# Patient Record
Sex: Male | Born: 1955 | Race: White | Hispanic: No | Marital: Married | Smoking: Former smoker
Health system: Southern US, Community
[De-identification: ages and names within clinical notes are randomized; demographics above are authoritative.]

## PROBLEM LIST (undated history)

## (undated) DIAGNOSIS — I10 Essential (primary) hypertension: Secondary | ICD-10-CM

## (undated) DIAGNOSIS — F419 Anxiety disorder, unspecified: Secondary | ICD-10-CM

## (undated) HISTORY — PX: HERNIA REPAIR: SHX51

## (undated) HISTORY — PX: BACK SURGERY: SHX140

---

## 2017-05-28 ENCOUNTER — Encounter: Payer: Self-pay | Admitting: Internal Medicine

## 2017-06-25 ENCOUNTER — Ambulatory Visit: Payer: Self-pay

## 2017-07-09 ENCOUNTER — Ambulatory Visit: Payer: Self-pay

## 2017-07-19 ENCOUNTER — Encounter: Payer: Self-pay | Admitting: Internal Medicine

## 2017-07-23 ENCOUNTER — Ambulatory Visit: Payer: Self-pay

## 2017-08-20 ENCOUNTER — Ambulatory Visit: Payer: Self-pay

## 2017-09-09 ENCOUNTER — Ambulatory Visit (INDEPENDENT_AMBULATORY_CARE_PROVIDER_SITE_OTHER): Payer: BLUE CROSS/BLUE SHIELD

## 2017-09-09 DIAGNOSIS — Z86010 Personal history of colonic polyps: Secondary | ICD-10-CM

## 2017-09-09 MED ORDER — NA SULFATE-K SULFATE-MG SULF 17.5-3.13-1.6 GM/177ML PO SOLN: 1.0000 | ORAL | 0 refills | Status: DC

## 2017-09-09 NOTE — Progress Notes (Signed)
Gastroenterology Pre-Procedure Review  Request Date:09/09/17 Requesting Physician: Dr.Vyas (no previous tcs)  PATIENT REVIEW QUESTIONS: The patient responded to the following health history questions as indicated:    1. Diabetes Melitis: no 2. Joint replacements in the past 12 months: no 3. Major health problems in the past 3 months: no 4. Has an artificial valve or MVP: no 5. Has a defibrillator: no 6. Has been advised in past to take antibiotics in advance of a procedure like teeth cleaning: no 7. Family history of colon cancer: no  8. Alcohol Use: no 9. History of sleep apnea: no  10. History of coronary artery or other vascular stents placed within the last 12 months: no 11. History of any prior anesthesia complications: no    MEDICATIONS & ALLERGIES:    Patient reports the following regarding taking any blood thinners:   Plavix? no Aspirin? no Coumadin? no Brilinta? no Xarelto? no Eliquis? no Pradaxa? no Savaysa? no Effient? no  Patient confirms/reports the following medications:  Current Outpatient Medications  Medication Sig Dispense Refill  . loratadine (CLARITIN) 10 MG tablet Take 10 mg by mouth daily.    . meloxicam (MOBIC) 15 MG tablet Take 15 mg by mouth daily.  2  . omeprazole (PRILOSEC) 20 MG capsule Take 20 mg by mouth daily.  3  . zolpidem (AMBIEN) 5 MG tablet Take 5 mg by mouth at bedtime.  2  . Na Sulfate-K Sulfate-Mg Sulf (SUPREP BOWEL PREP KIT) 17.5-3.13-1.6 GM/177ML SOLN Take 1 kit by mouth as directed. 1 Bottle 0   No current facility-administered medications for this visit.     Patient confirms/reports the following allergies:  No Known Allergies  No orders of the defined types were placed in this encounter.   AUTHORIZATION INFORMATION Primary Insurance: [place],  [place] #: [id number] Pre-Cert / Josem Kaufmann required: no   SCHEDULE INFORMATION: Procedure has been scheduled as follows:  Date: 10/25/17, Time: 7:30 Location: [facility] Dr.Rourk  This  Gastroenterology Pre-Precedure Review Form is being routed to the following provider(s): AB

## 2017-09-09 NOTE — Patient Instructions (Addendum)
Milam Allbaugh  02/14/1956 MRN: [id number]     Procedure Date: 10/09/17 Time to register: 12:45pm Place to register: [facility] Procedure Time: 1:45pm Scheduled provider: R. Garfield Cornea, MD    PREPARATION FOR COLONOSCOPY WITH SUPREP BOWEL PREP KIT  Note: Suprep Bowel Prep Kit is a split-dose (2day) regimen. Consumption of BOTH 6-ounce bottles is required for a complete prep.  Please notify us immediately if you are diabetic, take iron supplements, or if you are on Coumadin or any other blood thinners.                                                                                                                                                    2 DAYS BEFORE PROCEDURE:  DATE: 10/07/17  DAY: Monday Begin clear liquid diet AFTER your lunch meal. NO SOLID FOODS after this point.  1 DAY BEFORE PROCEDURE:  DATE: 10/08/17  DAY: Tuesday Continue clear liquids the entire day - NO SOLID FOOD.     At 6:00pm: Complete steps 1 through 4 below, using ONE (1) 6-ounce bottle, before going to bed. Step 1:  Pour ONE (1) 6-ounce bottle of SUPREP liquid into the mixing container.  Step 2:  Add cool drinking water to the 16 ounce line on the container and mix.  Note: Dilute the solution concentrate as directed prior to use. Step 3:  DRINK ALL the liquid in the container. Step 4:  You MUST drink an additional two (2) or more 16 ounce containers of water over the next one (1) hour.   Continue clear liquids.  DAY OF PROCEDURE:   DATE: 10/09/17  DAY: Wednesday If you take medications for your heart, blood pressure, or breathing, you may take these medications.    5 hours before your procedure at 8:45am: Step 1:  Pour ONE (1) 6-ounce bottle of SUPREP liquid into the mixing container.  Step 2:  Add cool drinking water to the 16 ounce line on the container and mix.  Note: Dilute the solution concentrate as directed prior to use. Step 3:  DRINK ALL the liquid in the container. Step 4:  You  MUST drink an additional two (2) or more 16 ounce containers of water over the next one (1) hour. You MUST complete the final glass of water at least 3 hours before your colonoscopy.   Nothing by mouth past 10:45am  You may take your morning medications with sip of water unless we have instructed otherwise.    Please see below for Dietary Information.  CLEAR LIQUIDS INCLUDE:  Water Jello (NOT red in color)   Ice Popsicles (NOT red in color)   Tea (sugar ok, no milk/cream) Powdered fruit flavored drinks  Coffee (sugar ok, no milk/cream) Gatorade/ Lemonade/ Kool-Aid  (NOT red in color)   Juice: apple, white grape, white cranberry Soft drinks  Clear bullion, consomme, broth (fat free beef/chicken/vegetable)  Carbonated beverages (any kind)  Strained chicken noodle soup Hard Candy   Remember: Clear liquids are liquids that will allow you to see your fingers on the other side of a clear glass. Be sure liquids are NOT red in color, and not cloudy, but CLEAR.  DO NOT EAT OR DRINK ANY OF THE FOLLOWING:  Dairy products of any kind   Cranberry juice Tomato juice / V8 juice   Grapefruit juice Orange juice     Red grape juice  Do not eat any solid foods, including such foods as: cereal, oatmeal, yogurt, fruits, vegetables, creamed soups, eggs, bread, crackers, pureed foods in a blender, etc.   HELPFUL HINTS FOR DRINKING PREP SOLUTION:   Make sure prep is extremely cold. Mix and refrigerate the the morning of the prep. You may also put in the freezer.   You may try mixing some Crystal Light or Country Time Lemonade if you prefer. Mix in small amounts; add more if necessary.  Try drinking through a straw  Rinse mouth with water or a mouthwash between glasses, to remove after-taste.  Try sipping on a cold beverage /ice/ popsicles between glasses of prep.  Place a piece of sugar-free hard candy in mouth between glasses.  If you become nauseated, try consuming smaller amounts, or stretch  out the time between glasses. Stop for 30-60 minutes, then slowly start back drinking.     OTHER INSTRUCTIONS  You will need a responsible adult at least 61 years of age to accompany you and drive you home. This person must remain in the waiting room during your procedure. The hospital will cancel your procedure if you do not have a responsible adult with you.   1. Wear loose fitting clothing that is easily removed. 2. Leave jewelry and other valuables at home.  3. Remove all body piercing jewelry and leave at home. 4. Total time from sign-in until discharge is approximately 2-3 hours. 5. You should go home directly after your procedure and rest. You can resume normal activities the day after your procedure. 6. The day of your procedure you should not:  Drive  Make legal decisions  Operate machinery  Drink alcohol  Return to work   You may call the office (Dept: [phone]) before 5:00pm, or page the doctor on call ([phone]) after 5:00pm, for further instructions, if necessary.   Insurance Information YOU WILL NEED TO CHECK WITH YOUR INSURANCE COMPANY FOR THE BENEFITS OF COVERAGE YOU HAVE FOR THIS PROCEDURE.  UNFORTUNATELY, NOT ALL INSURANCE COMPANIES HAVE BENEFITS TO COVER ALL OR PART OF THESE TYPES OF PROCEDURES.  IT IS YOUR RESPONSIBILITY TO CHECK YOUR BENEFITS, HOWEVER, WE WILL BE GLAD TO ASSIST YOU WITH ANY CODES YOUR INSURANCE COMPANY MAY NEED.    PLEASE NOTE THAT MOST INSURANCE COMPANIES WILL NOT COVER A SCREENING COLONOSCOPY FOR PEOPLE UNDER THE AGE OF 50  IF YOU HAVE BCBS INSURANCE, YOU MAY HAVE BENEFITS FOR A SCREENING COLONOSCOPY BUT IF POLYPS ARE FOUND THE DIAGNOSIS WILL CHANGE AND THEN YOU MAY HAVE A DEDUCTIBLE THAT WILL NEED TO BE MET. SO PLEASE MAKE SURE YOU CHECK YOUR BENEFITS FOR A SCREENING COLONOSCOPY AS WELL AS A DIAGNOSTIC COLONOSCOPY.      

## 2017-09-10 NOTE — Progress Notes (Signed)
Appropriate.

## 2017-09-16 ENCOUNTER — Telehealth: Payer: Self-pay | Admitting: Internal Medicine

## 2017-09-16 NOTE — Telephone Encounter (Signed)
Pt wants his colonoscopy moved up to sooner because of Labor Day. Please call him back (he works 3rd shift) [phone]

## 2017-09-17 ENCOUNTER — Telehealth: Payer: Self-pay | Admitting: Internal Medicine

## 2017-09-17 NOTE — Telephone Encounter (Signed)
[place] HE NEEDS TO RESCHEDULE HIS TCS, HE WORKS 3RD AND WILL BE ASLEEP IN ABOUT AN HOUR

## 2017-09-17 NOTE — Telephone Encounter (Signed)
Pt has been rescheduled. 

## 2017-09-17 NOTE — Progress Notes (Signed)
Pt called- he cannot get off work the day he is scheduled and needs to be rescheduled. I have moved him to 10/09/17 and have changed his instructions and mailed them to him. He also needs a new note for work and this has been mailed. I called Hoyle Sauer and LM to change his date on the schedule. Pt has already picked up his prep from the pharmacy.

## 2017-10-09 ENCOUNTER — Other Ambulatory Visit: Payer: Self-pay

## 2017-10-09 ENCOUNTER — Ambulatory Visit (HOSPITAL_COMMUNITY)
Admission: RE | Admit: 2017-10-09 | Discharge: 2017-10-09 | Disposition: A | Discharge status: Home / Self Care | Payer: BLUE CROSS/BLUE SHIELD | Source: Ambulatory Visit | Attending: Internal Medicine | Admitting: Internal Medicine

## 2017-10-09 ENCOUNTER — Encounter (HOSPITAL_COMMUNITY): Payer: Self-pay | Admitting: *Deleted

## 2017-10-09 ENCOUNTER — Encounter (HOSPITAL_COMMUNITY)
Admission: RE | Disposition: A | Discharge status: Home / Self Care | Payer: Self-pay | Source: Ambulatory Visit | Attending: Internal Medicine

## 2017-10-09 DIAGNOSIS — Z1211 Encounter for screening for malignant neoplasm of colon: Secondary | ICD-10-CM | POA: Diagnosis not present

## 2017-10-09 DIAGNOSIS — Z1212 Encounter for screening for malignant neoplasm of rectum: Secondary | ICD-10-CM | POA: Diagnosis not present

## 2017-10-09 DIAGNOSIS — K5730 Diverticulosis of large intestine without perforation or abscess without bleeding: Secondary | ICD-10-CM | POA: Insufficient documentation

## 2017-10-09 DIAGNOSIS — Z791 Long term (current) use of non-steroidal anti-inflammatories (NSAID): Secondary | ICD-10-CM | POA: Insufficient documentation

## 2017-10-09 DIAGNOSIS — Z79899 Other long term (current) drug therapy: Secondary | ICD-10-CM | POA: Insufficient documentation

## 2017-10-09 DIAGNOSIS — Z86010 Personal history of colonic polyps: Secondary | ICD-10-CM

## 2017-10-09 DIAGNOSIS — Z87891 Personal history of nicotine dependence: Secondary | ICD-10-CM | POA: Diagnosis not present

## 2017-10-09 DIAGNOSIS — D128 Benign neoplasm of rectum: Secondary | ICD-10-CM | POA: Insufficient documentation

## 2017-10-09 HISTORY — PX: COLONOSCOPY: SHX5424

## 2017-10-09 SURGERY — COLONOSCOPY
Anesthesia: Moderate Sedation

## 2017-10-09 MED ORDER — MIDAZOLAM HCL 5 MG/5ML IJ SOLN
INTRAMUSCULAR | Status: AC
  Filled 2017-10-09: qty 5

## 2017-10-09 MED ORDER — ONDANSETRON HCL 4 MG/2ML IJ SOLN
INTRAMUSCULAR | Status: AC
  Filled 2017-10-09: qty 2

## 2017-10-09 MED ORDER — MEPERIDINE HCL 50 MG/ML IJ SOLN
INTRAMUSCULAR | Status: AC
  Filled 2017-10-09: qty 1

## 2017-10-09 MED ORDER — MIDAZOLAM HCL 5 MG/5ML IJ SOLN
INTRAMUSCULAR | Status: DC | PRN
  Administered 2017-10-09: 2 mg via INTRAVENOUS
  Administered 2017-10-09: 1 mg via INTRAVENOUS
  Administered 2017-10-09: 2 mg via INTRAVENOUS
  Administered 2017-10-09: 1 mg via INTRAVENOUS

## 2017-10-09 MED ORDER — MEPERIDINE HCL 100 MG/ML IJ SOLN
INTRAMUSCULAR | Status: DC | PRN
  Administered 2017-10-09: 25 mg

## 2017-10-09 MED ORDER — ONDANSETRON HCL 4 MG/2ML IJ SOLN
INTRAMUSCULAR | Status: DC | PRN
  Administered 2017-10-09: 4 mg via INTRAVENOUS

## 2017-10-09 MED ORDER — SODIUM CHLORIDE 0.9 % IV SOLN
INTRAVENOUS | Status: DC
  Administered 2017-10-09: 1000 mL via INTRAVENOUS

## 2017-10-09 NOTE — H&P (Signed)
@LOGO @   Primary Care Physician:  Glenda Chroman, MD Primary Gastroenterologist:  Dr. Gala Romney  Pre-Procedure History & Physical: HPI:  Brendan Sherman is a 62 y.o. male here for screening colonoscopy. No bowel symptoms. No family history of colon cancer. No prior colonoscopy.  History reviewed. No pertinent past medical history.  Past Surgical History:  Procedure Laterality Date  . HERNIA REPAIR      Prior to Admission medications   Medication Sig Start Date End Date Taking? Authorizing Provider  diclofenac sodium (VOLTAREN) 1 % GEL Apply 1 application topically daily. 09/16/17  Yes [provider]  loratadine (CLARITIN) 10 MG tablet Take 10 mg by mouth daily.   Yes [provider]  meloxicam (MOBIC) 15 MG tablet Take 15 mg by mouth daily. 08/26/17  Yes [provider]  Na Sulfate-K Sulfate-Mg Sulf (SUPREP BOWEL PREP KIT) 17.5-3.13-1.6 GM/177ML SOLN Take 1 kit by mouth as directed. 09/09/17  Yes Annitta Needs, NP  omeprazole (PRILOSEC) 20 MG capsule Take 20 mg by mouth daily as needed (for acid reflux).  08/21/17  Yes [provider]  zolpidem (AMBIEN) 5 MG tablet Take 5 mg by mouth at bedtime. 08/27/17  Yes [provider]    Allergies as of 09/09/2017  . (No Known Allergies)    History reviewed. No pertinent family history.  Social History   Socioeconomic History  . Marital status: Married    Spouse name: Not on file  . Number of children: Not on file  . Years of education: Not on file  . Highest education level: Not on file  Occupational History  . Not on file  Social Needs  . Financial resource strain: Not on file  . Food insecurity:    Worry: Not on file    Inability: Not on file  . Transportation needs:    Medical: Not on file    Non-medical: Not on file  Tobacco Use  . Smoking status: Former Research scientist (life sciences)  . Smokeless tobacco: Former Network engineer and Sexual Activity  . Alcohol use: Yes    Comment: rarely  . Drug use: Never  .  Sexual activity: Not on file  Lifestyle  . Physical activity:    Days per week: Not on file    Minutes per session: Not on file  . Stress: Not on file  Relationships  . Social connections:    Talks on phone: Not on file    Gets together: Not on file    Attends religious service: Not on file    Active member of club or organization: Not on file    Attends meetings of clubs or organizations: Not on file    Relationship status: Not on file  . Intimate partner violence:    Fear of current or ex partner: Not on file    Emotionally abused: Not on file    Physically abused: Not on file    Forced sexual activity: Not on file  Other Topics Concern  . Not on file  Social History Narrative  . Not on file    Review of Systems: See HPI, otherwise negative ROS  Physical Exam: BP (!) 169/99   Pulse 78   Temp 98 F (36.7 C) (Oral)   Resp 13   Ht 5' 9.5" (1.765 m)   Wt 80.7 kg   SpO2 100%   BMI 25.91 kg/m  General:   Alert,  Well-developed, well-nourished, pleasant and cooperative in NAD  Lungs:  Clear throughout to auscultation.  No wheezes, crackles, or rhonchi. No acute distress. Heart:  Regular rate and rhythm; no murmurs, clicks, rubs,  or gallops. Abdomen: Non-distended, normal bowel sounds.  Soft and nontender without appreciable mass or hepatosplenomegaly.  Pulses:  Normal pulses noted. Extremities:  Without clubbing or edema.  Impression/Plan:  71 year old child here for first ever screening colonoscopy-average risk.  The risks, benefits, limitations, alternatives and imponderables have been reviewed with the patient. Questions have been answered. All parties are agreeable.      Notice: This dictation was prepared with Dragon dictation along with smaller phrase technology. Any transcriptional errors that result from this process are unintentional and may not be corrected upon review.

## 2017-10-09 NOTE — Discharge Instructions (Signed)
Colonoscopy Discharge Instructions  Read the instructions outlined below and refer to this sheet in the next few weeks. These discharge instructions provide you with general information on caring for yourself after you leave the hospital. Your doctor may also give you specific instructions. While your treatment has been planned according to the most current medical practices available, unavoidable complications occasionally occur. If you have any problems or questions after discharge, call Dr. Gala Romney at [phone]. ACTIVITY  You may resume your regular activity, but move at a slower pace for the next 24 hours.   Take frequent rest periods for the next 24 hours.   Walking will help get rid of the air and reduce the bloated feeling in your belly (abdomen).   No driving for 24 hours (because of the medicine (anesthesia) used during the test).    Do not sign any important legal documents or operate any machinery for 24 hours (because of the anesthesia used during the test).  NUTRITION  Drink plenty of fluids.   You may resume your normal diet as instructed by your doctor.   Begin with a light meal and progress to your normal diet. Heavy or fried foods are harder to digest and may make you feel sick to your stomach (nauseated).   Avoid alcoholic beverages for 24 hours or as instructed.  MEDICATIONS  You may resume your normal medications unless your doctor tells you otherwise.  WHAT YOU CAN EXPECT TODAY  Some feelings of bloating in the abdomen.   Passage of more gas than usual.   Spotting of blood in your stool or on the toilet paper.  IF YOU HAD POLYPS REMOVED DURING THE COLONOSCOPY:  No aspirin products for 7 days or as instructed.   No alcohol for 7 days or as instructed.   Eat a soft diet for the next 24 hours.  FINDING OUT THE RESULTS OF YOUR TEST Not all test results are available during your visit. If your test results are not back during the visit, make an appointment  with your caregiver to find out the results. Do not assume everything is normal if you have not heard from your caregiver or the medical facility. It is important for you to follow up on all of your test results.  SEEK IMMEDIATE MEDICAL ATTENTION IF:  You have more than a spotting of blood in your stool.   Your belly is swollen (abdominal distention).   You are nauseated or vomiting.   You have a temperature over 101.   You have abdominal pain or discomfort that is severe or gets worse throughout the day.     Diverticulosis and colon polyp information provided  Further recommendations to follow pending review of pathology report   Diverticulosis Diverticulosis is a condition that develops when small pouches (diverticula) form in the wall of the large intestine (colon). The colon is where water is absorbed and stool is formed. The pouches form when the inside layer of the colon pushes through weak spots in the outer layers of the colon. You may have a few pouches or many of them. What are the causes? The cause of this condition is not known. What increases the risk? The following factors may make you more likely to develop this condition:  Being older than age 52. Your risk for this condition increases with age. Diverticulosis is rare among people younger than age 35. By age 66, many people have it.  Eating a low-fiber diet.  Having frequent constipation.  Being  overweight.  Not getting enough exercise.  Smoking.  Taking over-the-counter pain medicines, like aspirin and ibuprofen.  Having a family history of diverticulosis.  What are the signs or symptoms? In most people, there are no symptoms of this condition. If you do have symptoms, they may include:  Bloating.  Cramps in the abdomen.  Constipation or diarrhea.  Pain in the lower left side of the abdomen.  How is this diagnosed? This condition is most often diagnosed during an exam for other colon problems.  Because diverticulosis usually has no symptoms, it often cannot be diagnosed independently. This condition may be diagnosed by:  Using a flexible scope to examine the colon (colonoscopy).  Taking an X-ray of the colon after dye has been put into the colon (barium enema).  Doing a CT scan.  How is this treated? You may not need treatment for this condition if you have never developed an infection related to diverticulosis. If you have had an infection before, treatment may include:  Eating a high-fiber diet. This may include eating more fruits, vegetables, and grains.  Taking a fiber supplement.  Taking a live bacteria supplement (probiotic).  Taking medicine to relax your colon.  Taking antibiotic medicines.  Follow these instructions at home:  Drink 6-8 glasses of water or more each day to prevent constipation.  Try not to strain when you have a bowel movement.  If you have had an infection before: ? Eat more fiber as directed by your health care provider or your diet and nutrition specialist (dietitian). ? Take a fiber supplement or probiotic, if your health care provider approves.  Take over-the-counter and prescription medicines only as told by your health care provider.  If you were prescribed an antibiotic, take it as told by your health care provider. Do not stop taking the antibiotic even if you start to feel better.  Keep all follow-up visits as told by your health care provider. This is important. Contact a health care provider if:  You have pain in your abdomen.  You have bloating.  You have cramps.  You have not had a bowel movement in 3 days. Get help right away if:  Your pain gets worse.  Your bloating becomes very bad.  You have a fever or chills, and your symptoms suddenly get worse.  You vomit.  You have bowel movements that are bloody or black.  You have bleeding from your rectum. Summary  Diverticulosis is a condition that develops when  small pouches (diverticula) form in the wall of the large intestine (colon).  You may have a few pouches or many of them.  This condition is most often diagnosed during an exam for other colon problems.  If you have had an infection related to diverticulosis, treatment may include increasing the fiber in your diet, taking supplements, or taking medicines. This information is not intended to replace advice given to you by your health care provider. Make sure you discuss any questions you have with your health care provider.    Colon Polyps Polyps are tissue growths inside the body. Polyps can grow in many places, including the large intestine (colon). A polyp may be a round bump or a mushroom-shaped growth. You could have one polyp or several. Most colon polyps are noncancerous (benign). However, some colon polyps can become cancerous over time. What are the causes? The exact cause of colon polyps is not known. What increases the risk? This condition is more likely to develop in people who:  Have a family history of colon cancer or colon polyps.  Are older than 41 or older than 45 if they are African American.  Have inflammatory bowel disease, such as ulcerative colitis or Crohn disease.  Are overweight.  Smoke cigarettes.  Do not get enough exercise.  Drink too much alcohol.  Eat a diet that is: ? High in fat and red meat. ? Low in fiber.  Had childhood cancer that was treated with abdominal radiation.  What are the signs or symptoms? Most polyps do not cause symptoms. If you have symptoms, they may include:  Blood coming from your rectum when having a bowel movement.  Blood in your stool.The stool may look dark red or black.  A change in bowel habits, such as constipation or diarrhea.  How is this diagnosed? This condition is diagnosed with a colonoscopy. This is a procedure that uses a lighted, flexible scope to look at the inside of your colon. How is this  treated? Treatment for this condition involves removing any polyps that are found. Those polyps will then be tested for cancer. If cancer is found, your health care provider will talk to you about options for colon cancer treatment. Follow these instructions at home: Diet  Eat plenty of fiber, such as fruits, vegetables, and whole grains.  Eat foods that are high in calcium and vitamin D, such as milk, cheese, yogurt, eggs, liver, fish, and broccoli.  Limit foods high in fat, red meats, and processed meats, such as hot dogs, sausage, bacon, and lunch meats.  Maintain a healthy weight, or lose weight if recommended by your health care provider. General instructions  Do not smoke cigarettes.  Do not drink alcohol excessively.  Keep all follow-up visits as told by your health care provider. This is important. This includes keeping regularly scheduled colonoscopies. Talk to your health care provider about when you need a colonoscopy.  Exercise every day or as told by your health care provider. Contact a health care provider if:  You have new or worsening bleeding during a bowel movement.  You have new or increased blood in your stool.  You have a change in bowel habits.  You unexpectedly lose weight. This information is not intended to replace advice given to you by your health care provider. Make sure you discuss any questions you have with your health care provider.

## 2017-10-09 NOTE — Op Note (Signed)
[facility] Patient Name: Brendan Sherman Procedure Date: 10/09/2017 1:37 PM MRN: [id number] Date of Birth: 27-Apr-1955 Attending MD: Norvel Richards , MD CSN: [id number] Age: 62 Admit Type: Outpatient Procedure:                Colonoscopy Indications:              Screening for colorectal malignant neoplasm Providers:                Norvel Richards, MD, Janeece Riggers, RN, Randa Spike, Technician Referring MD:              Medicines:                Midazolam 6 mg IV, Meperidine 25 mg IV, Ondansetron                            4 mg IV Complications:            No immediate complications. Estimated Blood Loss:     Estimated blood loss: none. Procedure:                Pre-Anesthesia Assessment:                           - Prior to the procedure, a History and Physical                            was performed, and patient medications and                            allergies were reviewed. The patient's tolerance of                            previous anesthesia was also reviewed. The risks                            and benefits of the procedure and the sedation                            options and risks were discussed with the patient.                            All questions were answered, and informed consent                            was obtained. Prior Anticoagulants: The patient has                            taken no previous anticoagulant or antiplatelet                            agents. ASA Grade Assessment: II - A patient with  mild systemic disease. After reviewing the risks                            and benefits, the patient was deemed in                            satisfactory condition to undergo the procedure.                           After obtaining informed consent, the colonoscope                            was passed under direct vision. Throughout the                            procedure, the patient's  blood pressure, pulse, and                            oxygen saturations were monitored continuously. The                            CF-HQ190L ([id number]) scope was introduced through                            the anus and advanced to the the cecum, identified                            by appendiceal orifice and ileocecal valve. The                            colonoscopy was performed without difficulty. The                            patient tolerated the procedure well. The quality                            of the bowel preparation was adequate. The entire                            colon was well visualized. The entire colon was                            well visualized. Scope In: 1:55:32 PM Scope Out: 2:10:53 PM Scope Withdrawal Time: 0 hours 11 minutes 24 seconds  Total Procedure Duration: 0 hours 15 minutes 21 seconds  Findings:      The perianal and digital rectal examinations were normal.      Scattered small-mouthed diverticula were found in the sigmoid colon and       descending colon.      A 7 mm polyp was found in the rectum. The polyp was semi-pedunculated.       The polyp was removed with a hot snare. Resection and retrieval were       complete. Estimated blood loss: none.      The exam was otherwise without abnormality  on direct and retroflexion       views. Impression:               - Diverticulosis in the sigmoid colon and in the                            descending colon.                           - One 7 mm polyp in the rectum, removed with a hot                            snare. Resected and retrieved.                           - The examination was otherwise normal on direct                            and retroflexion views. Moderate Sedation:      Moderate (conscious) sedation was administered by the endoscopy nurse       and supervised by the endoscopist. The following parameters were       monitored: oxygen saturation, heart rate, blood pressure, respiratory        rate, EKG, adequacy of pulmonary ventilation, and response to care.       Total physician intraservice time was 21 minutes. Recommendation:           - Patient has a contact number available for                            emergencies. The signs and symptoms of potential                            delayed complications were discussed with the                            patient. Return to normal activities tomorrow.                            Written discharge instructions were provided to the                            patient.                           - Resume previous diet.                           - Continue present medications.                           - Repeat colonoscopy date to be determined after                            pending pathology results are reviewed for  surveillance. Procedure Code(s):        --- Professional ---                           [phone], Colonoscopy, flexible; with removal of                            tumor(s), polyp(s), or other lesion(s) by snare                            technique                           G0500, Moderate sedation services provided by the                            same physician or other qualified health care                            professional performing a gastrointestinal                            endoscopic service that sedation supports,                            requiring the presence of an independent trained                            observer to assist in the monitoring of the                            patient's level of consciousness and physiological                            status; initial 15 minutes of intra-service time;                            patient age 68 years or older (additional time may                            be reported with [id number], as appropriate) Diagnosis Code(s):        --- Professional ---                           Z12.11, Encounter for screening for malignant                             neoplasm of colon                           K62.1, Rectal polyp                           K57.30, Diverticulosis of large intestine without  perforation or abscess without bleeding CPT copyright 2017 American Medical Association. All rights reserved. The codes documented in this report are preliminary and upon coder review may  be revised to meet current compliance requirements. Cristopher Estimable. Yasmeen Manka, MD Norvel Richards, MD 10/09/2017 2:22:00 PM This report has been signed electronically. Number of Addenda: 0

## 2017-10-12 ENCOUNTER — Encounter: Payer: Self-pay | Admitting: Internal Medicine

## 2017-10-14 ENCOUNTER — Telehealth: Payer: Self-pay | Admitting: Internal Medicine

## 2017-10-14 ENCOUNTER — Other Ambulatory Visit: Payer: Self-pay

## 2017-10-14 ENCOUNTER — Encounter (HOSPITAL_COMMUNITY): Payer: Self-pay | Admitting: Internal Medicine

## 2017-10-14 MED ORDER — HYDROCORTISONE 2.5 % RE CREA: 1.0000 "application " | TOPICAL_CREAM | Freq: Three times a day (TID) | RECTAL | 0 refills | Status: DC

## 2017-10-14 NOTE — Telephone Encounter (Signed)
Pt's wife, Vaughan Basta, called asking to speak with the nurse. Pt had procedure done on 8/14 and had a polyp removed near the rectum and it's sore and not healing well according to the patient. He is wanting something called into [facility] in [place]. [phone]

## 2017-10-14 NOTE — Telephone Encounter (Signed)
Spoke with spouse. Pt is having trouble sitting down and walking s/p TCS procedure and removal of Polyp. Spouse states that the pts rectum is blood red on the lt side of his rectum. He reports no nausea, no vomiting. Pt is requesting medication to help rectum heal.

## 2017-10-14 NOTE — Telephone Encounter (Signed)
We can try some Anusol cream. Dispense one tube. Apply pea-sized amount rectum 3 times a day as needed. No refills.

## 2017-10-14 NOTE — Telephone Encounter (Signed)
Noted. Pts spouse notified of that medication was sent to pts pharmacy.

## 2018-04-10 ENCOUNTER — Inpatient Hospital Stay (HOSPITAL_COMMUNITY)
Admission: EM | Admit: 2018-04-10 | Discharge: 2018-04-16 | DRG: 460 | Disposition: A | Discharge status: Skilled Nursing Facility | Payer: BLUE CROSS/BLUE SHIELD | Source: Ambulatory Visit | Attending: Neurological Surgery | Admitting: Neurological Surgery

## 2018-04-10 ENCOUNTER — Encounter (HOSPITAL_COMMUNITY)
Admission: EM | Disposition: A | Discharge status: Skilled Nursing Facility | Payer: Self-pay | Source: Ambulatory Visit | Attending: Neurological Surgery

## 2018-04-10 ENCOUNTER — Other Ambulatory Visit: Payer: Self-pay | Admitting: Neurological Surgery

## 2018-04-10 ENCOUNTER — Encounter (HOSPITAL_COMMUNITY): Payer: Self-pay

## 2018-04-10 ENCOUNTER — Other Ambulatory Visit: Payer: Self-pay

## 2018-04-10 ENCOUNTER — Emergency Department (HOSPITAL_COMMUNITY): Payer: BLUE CROSS/BLUE SHIELD

## 2018-04-10 ENCOUNTER — Emergency Department (HOSPITAL_COMMUNITY): Payer: BLUE CROSS/BLUE SHIELD | Admitting: Certified Registered Nurse Anesthetist

## 2018-04-10 DIAGNOSIS — M62838 Other muscle spasm: Secondary | ICD-10-CM | POA: Diagnosis not present

## 2018-04-10 DIAGNOSIS — Z79899 Other long term (current) drug therapy: Secondary | ICD-10-CM

## 2018-04-10 DIAGNOSIS — Z87891 Personal history of nicotine dependence: Secondary | ICD-10-CM

## 2018-04-10 DIAGNOSIS — S32049A Unspecified fracture of fourth lumbar vertebra, initial encounter for closed fracture: Secondary | ICD-10-CM

## 2018-04-10 DIAGNOSIS — I10 Essential (primary) hypertension: Secondary | ICD-10-CM | POA: Diagnosis present

## 2018-04-10 DIAGNOSIS — S32059A Unspecified fracture of fifth lumbar vertebra, initial encounter for closed fracture: Secondary | ICD-10-CM

## 2018-04-10 DIAGNOSIS — K5900 Constipation, unspecified: Secondary | ICD-10-CM | POA: Diagnosis not present

## 2018-04-10 DIAGNOSIS — S32042A Unstable burst fracture of fourth lumbar vertebra, initial encounter for closed fracture: Principal | ICD-10-CM | POA: Diagnosis present

## 2018-04-10 DIAGNOSIS — F419 Anxiety disorder, unspecified: Secondary | ICD-10-CM | POA: Diagnosis present

## 2018-04-10 DIAGNOSIS — W132XXA Fall from, out of or through roof, initial encounter: Secondary | ICD-10-CM | POA: Diagnosis present

## 2018-04-10 DIAGNOSIS — Z419 Encounter for procedure for purposes other than remedying health state, unspecified: Secondary | ICD-10-CM

## 2018-04-10 HISTORY — DX: Essential (primary) hypertension: I10

## 2018-04-10 HISTORY — DX: Anxiety disorder, unspecified: F41.9

## 2018-04-10 LAB — COMPREHENSIVE METABOLIC PANEL
ALT: 39 U/L (ref 0–44)
ANION GAP: 13 (ref 5–15)
AST: 27 U/L (ref 15–41)
Albumin: 3.7 g/dL (ref 3.5–5.0)
Alkaline Phosphatase: 63 U/L (ref 38–126)
BUN: 17 mg/dL (ref 8–23)
CALCIUM: 9.1 mg/dL (ref 8.9–10.3)
CHLORIDE: 103 mmol/L (ref 98–111)
CO2: 24 mmol/L (ref 22–32)
CREATININE: 1.01 mg/dL (ref 0.61–1.24)
Glucose, Bld: 93 mg/dL (ref 70–99)
Potassium: 3.8 mmol/L (ref 3.5–5.1)
Sodium: 140 mmol/L (ref 135–145)
Total Bilirubin: 1.2 mg/dL (ref 0.3–1.2)
Total Protein: 6.6 g/dL (ref 6.5–8.1)

## 2018-04-10 LAB — CBC WITH DIFFERENTIAL/PLATELET
ABS IMMATURE GRANULOCYTES: 0.04 10*3/uL (ref 0.00–0.07)
BASOS PCT: 0 %
Basophils Absolute: 0 10*3/uL (ref 0.0–0.1)
EOS ABS: 0.1 10*3/uL (ref 0.0–0.5)
EOS PCT: 1 %
HCT: 51.3 % (ref 39.0–52.0)
Hemoglobin: 16.8 g/dL (ref 13.0–17.0)
Immature Granulocytes: 1 %
Lymphocytes Relative: 13 %
Lymphs Abs: 1.1 10*3/uL (ref 0.7–4.0)
MCH: 30.1 pg (ref 26.0–34.0)
MCHC: 32.7 g/dL (ref 30.0–36.0)
MCV: 91.9 fL (ref 80.0–100.0)
MONO ABS: 0.7 10*3/uL (ref 0.1–1.0)
MONOS PCT: 8 %
Neutro Abs: 6.8 10*3/uL (ref 1.7–7.7)
Neutrophils Relative %: 77 %
Platelets: 170 10*3/uL (ref 150–400)
RBC: 5.58 MIL/uL (ref 4.22–5.81)
RDW: 13.1 % (ref 11.5–15.5)
WBC: 8.8 10*3/uL (ref 4.0–10.5)
nRBC: 0 % (ref 0.0–0.2)

## 2018-04-10 LAB — PROTIME-INR
INR: 1.04
PROTHROMBIN TIME: 13.5 s (ref 11.4–15.2)

## 2018-04-10 LAB — ABO/RH: ABO/RH(D): B POS

## 2018-04-10 LAB — TYPE AND SCREEN
ABO/RH(D): B POS
Antibody Screen: NEGATIVE

## 2018-04-10 SURGERY — POSTERIOR LUMBAR FUSION 2 LEVEL
Anesthesia: General | Site: Spine Lumbar

## 2018-04-10 MED ORDER — LACTATED RINGERS IV SOLN
INTRAVENOUS | Status: DC
  Administered 2018-04-10 (×3): via INTRAVENOUS

## 2018-04-10 MED ORDER — BUPIVACAINE HCL (PF) 0.5 % IJ SOLN
INTRAMUSCULAR | Status: AC
  Filled 2018-04-10: qty 30

## 2018-04-10 MED ORDER — POLYETHYLENE GLYCOL 3350 17 G PO PACK
17.0000 g | PACK | Freq: Every day | ORAL | Status: DC | PRN
  Administered 2018-04-12 – 2018-04-13 (×2): 17 g via ORAL
  Filled 2018-04-10 (×2): qty 1

## 2018-04-10 MED ORDER — THROMBIN 5000 UNITS EX SOLR
CUTANEOUS | Status: AC
  Filled 2018-04-10: qty 5000

## 2018-04-10 MED ORDER — VECURONIUM BROMIDE 10 MG IV SOLR
INTRAVENOUS | Status: DC | PRN
  Administered 2018-04-10 (×3): 2 mg via INTRAVENOUS

## 2018-04-10 MED ORDER — PANTOPRAZOLE SODIUM 40 MG PO TBEC
40.0000 mg | DELAYED_RELEASE_TABLET | Freq: Every day | ORAL | Status: DC
  Administered 2018-04-11 – 2018-04-16 (×6): 40 mg via ORAL
  Filled 2018-04-10 (×6): qty 1

## 2018-04-10 MED ORDER — SODIUM CHLORIDE 0.9 % IV SOLN
INTRAVENOUS | Status: DC | PRN
  Administered 2018-04-10: 40 ug/min via INTRAVENOUS

## 2018-04-10 MED ORDER — OXYCODONE HCL 5 MG PO TABS
5.0000 mg | ORAL_TABLET | Freq: Once | ORAL | Status: AC | PRN
  Administered 2018-04-10: 5 mg via ORAL

## 2018-04-10 MED ORDER — CEFAZOLIN SODIUM-DEXTROSE 2-4 GM/100ML-% IV SOLN
INTRAVENOUS | Status: AC
  Administered 2018-04-11: 2000 mg
  Filled 2018-04-10: qty 100

## 2018-04-10 MED ORDER — LIDOCAINE-EPINEPHRINE 1 %-1:100000 IJ SOLN
INTRAMUSCULAR | Status: AC
  Filled 2018-04-10: qty 1

## 2018-04-10 MED ORDER — OXYCODONE HCL 5 MG PO TABS
ORAL_TABLET | ORAL | Status: AC
  Filled 2018-04-10: qty 1

## 2018-04-10 MED ORDER — PHENYLEPHRINE HCL 10 MG/ML IJ SOLN
INTRAMUSCULAR | Status: DC | PRN
  Administered 2018-04-10 (×5): 80 ug via INTRAVENOUS

## 2018-04-10 MED ORDER — PROMETHAZINE HCL 25 MG PO TABS: 12.5000 mg | ORAL_TABLET | ORAL | Status: DC | PRN

## 2018-04-10 MED ORDER — DEXAMETHASONE SODIUM PHOSPHATE 4 MG/ML IJ SOLN
INTRAMUSCULAR | Status: DC | PRN
  Administered 2018-04-10: 10 mg via INTRAVENOUS

## 2018-04-10 MED ORDER — MIDAZOLAM HCL 5 MG/5ML IJ SOLN
INTRAMUSCULAR | Status: DC | PRN
  Administered 2018-04-10: 2 mg via INTRAVENOUS

## 2018-04-10 MED ORDER — ACETAMINOPHEN 325 MG PO TABS: 650.0000 mg | ORAL_TABLET | ORAL | Status: DC | PRN

## 2018-04-10 MED ORDER — ACETAMINOPHEN 650 MG RE SUPP: 650.0000 mg | RECTAL | Status: DC | PRN

## 2018-04-10 MED ORDER — HEPARIN SODIUM (PORCINE) 5000 UNIT/ML IJ SOLN
5000.0000 [IU] | Freq: Three times a day (TID) | INTRAMUSCULAR | Status: DC
  Administered 2018-04-12 – 2018-04-16 (×12): 5000 [IU] via SUBCUTANEOUS
  Filled 2018-04-10 (×12): qty 1

## 2018-04-10 MED ORDER — LIDOCAINE HCL (CARDIAC) PF 100 MG/5ML IV SOSY
PREFILLED_SYRINGE | INTRAVENOUS | Status: DC | PRN
  Administered 2018-04-10: 80 mg via INTRAVENOUS

## 2018-04-10 MED ORDER — ESCITALOPRAM OXALATE 10 MG PO TABS
20.0000 mg | ORAL_TABLET | Freq: Every day | ORAL | Status: DC
  Administered 2018-04-11 – 2018-04-16 (×6): 20 mg via ORAL
  Filled 2018-04-10 (×6): qty 2

## 2018-04-10 MED ORDER — HYDROMORPHONE HCL 1 MG/ML IJ SOLN: 0.5000 mg | INTRAMUSCULAR | Status: DC | PRN

## 2018-04-10 MED ORDER — THROMBIN 5000 UNITS EX SOLR
OROMUCOSAL | Status: DC | PRN
  Administered 2018-04-10: 18:00:00 via TOPICAL

## 2018-04-10 MED ORDER — BUPIVACAINE HCL (PF) 0.5 % IJ SOLN
INTRAMUSCULAR | Status: DC | PRN
  Administered 2018-04-10: 5 mL

## 2018-04-10 MED ORDER — SUGAMMADEX SODIUM 200 MG/2ML IV SOLN
INTRAVENOUS | Status: DC | PRN
  Administered 2018-04-10: 200 mg via INTRAVENOUS

## 2018-04-10 MED ORDER — ONDANSETRON HCL 4 MG/2ML IJ SOLN: 4.0000 mg | INTRAMUSCULAR | Status: DC | PRN

## 2018-04-10 MED ORDER — 0.9 % SODIUM CHLORIDE (POUR BTL) OPTIME
TOPICAL | Status: DC | PRN
  Administered 2018-04-10: 1000 mL

## 2018-04-10 MED ORDER — LISINOPRIL 10 MG PO TABS
10.0000 mg | ORAL_TABLET | Freq: Every day | ORAL | Status: DC
  Administered 2018-04-11 – 2018-04-16 (×5): 10 mg via ORAL
  Filled 2018-04-10 (×6): qty 1

## 2018-04-10 MED ORDER — FENTANYL CITRATE (PF) 100 MCG/2ML IJ SOLN: 25.0000 ug | INTRAMUSCULAR | Status: DC | PRN

## 2018-04-10 MED ORDER — SODIUM CHLORIDE 0.9 % IV SOLN
INTRAVENOUS | Status: DC | PRN
  Administered 2018-04-10: 18:00:00

## 2018-04-10 MED ORDER — ONDANSETRON HCL 4 MG PO TABS: 4.0000 mg | ORAL_TABLET | ORAL | Status: DC | PRN

## 2018-04-10 MED ORDER — PROPOFOL 10 MG/ML IV BOLUS
INTRAVENOUS | Status: DC | PRN
  Administered 2018-04-10: 150 mg via INTRAVENOUS

## 2018-04-10 MED ORDER — OXYCODONE HCL 5 MG PO TABS
5.0000 mg | ORAL_TABLET | ORAL | Status: DC | PRN
  Administered 2018-04-14: 5 mg via ORAL
  Filled 2018-04-10: qty 1

## 2018-04-10 MED ORDER — OXYCODONE HCL 5 MG/5ML PO SOLN: 5.0000 mg | Freq: Once | ORAL | Status: AC | PRN

## 2018-04-10 MED ORDER — FENTANYL CITRATE (PF) 100 MCG/2ML IJ SOLN
INTRAMUSCULAR | Status: DC | PRN
  Administered 2018-04-10: 100 ug via INTRAVENOUS
  Administered 2018-04-10: 50 ug via INTRAVENOUS

## 2018-04-10 MED ORDER — ONDANSETRON HCL 4 MG/2ML IJ SOLN: 4.0000 mg | Freq: Four times a day (QID) | INTRAMUSCULAR | Status: DC | PRN

## 2018-04-10 MED ORDER — HYDROMORPHONE HCL 1 MG/ML IJ SOLN
1.0000 mg | Freq: Once | INTRAMUSCULAR | Status: AC
  Administered 2018-04-10: 1 mg via INTRAVENOUS
  Filled 2018-04-10: qty 1

## 2018-04-10 MED ORDER — OXYCODONE HCL 5 MG PO TABS
10.0000 mg | ORAL_TABLET | ORAL | Status: DC | PRN
  Administered 2018-04-11 – 2018-04-16 (×19): 10 mg via ORAL
  Filled 2018-04-10 (×20): qty 2

## 2018-04-10 MED ORDER — CEFAZOLIN SODIUM-DEXTROSE 2-4 GM/100ML-% IV SOLN
2.0000 g | Freq: Three times a day (TID) | INTRAVENOUS | Status: AC
  Administered 2018-04-11 (×2): 2 g via INTRAVENOUS
  Filled 2018-04-10 (×2): qty 100

## 2018-04-10 MED ORDER — LABETALOL HCL 5 MG/ML IV SOLN
10.0000 mg | INTRAVENOUS | Status: DC | PRN
  Filled 2018-04-10: qty 8

## 2018-04-10 MED ORDER — ROCURONIUM BROMIDE 100 MG/10ML IV SOLN
INTRAVENOUS | Status: DC | PRN
  Administered 2018-04-10: 50 mg via INTRAVENOUS

## 2018-04-10 MED ORDER — DOCUSATE SODIUM 100 MG PO CAPS
100.0000 mg | ORAL_CAPSULE | Freq: Two times a day (BID) | ORAL | Status: DC
  Administered 2018-04-10 – 2018-04-16 (×11): 100 mg via ORAL
  Filled 2018-04-10 (×11): qty 1

## 2018-04-10 MED ORDER — ZOLPIDEM TARTRATE 5 MG PO TABS
5.0000 mg | ORAL_TABLET | Freq: Every day | ORAL | Status: DC
  Administered 2018-04-10 – 2018-04-15 (×6): 5 mg via ORAL
  Filled 2018-04-10 (×6): qty 1

## 2018-04-10 MED ORDER — LIDOCAINE-EPINEPHRINE 1 %-1:100000 IJ SOLN
INTRAMUSCULAR | Status: DC | PRN
  Administered 2018-04-10: 5 mL

## 2018-04-10 MED ORDER — CEFAZOLIN SODIUM-DEXTROSE 2-3 GM-%(50ML) IV SOLR
INTRAVENOUS | Status: DC | PRN
  Administered 2018-04-10: 2 g via INTRAVENOUS

## 2018-04-10 SURGICAL SUPPLY — 71 items
BAG DECANTER FOR FLEXI CONT (MISCELLANEOUS) ×3 IMPLANT
BASKET BONE COLLECTION (BASKET) ×3 IMPLANT
BENZOIN TINCTURE PRP APPL 2/3 (GAUZE/BANDAGES/DRESSINGS) IMPLANT
BLADE SURG 11 STRL SS (BLADE) ×3 IMPLANT
BUR MATCHSTICK NEURO 3.0 LAGG (BURR) ×3 IMPLANT
BUR PRECISION FLUTE 5.0 (BURR) ×3 IMPLANT
CANISTER SUCT 3000ML PPV (MISCELLANEOUS) ×3 IMPLANT
CLOSURE WOUND 1/2 X4 (GAUZE/BANDAGES/DRESSINGS)
CONT SPEC 4OZ CLIKSEAL STRL BL (MISCELLANEOUS) ×3 IMPLANT
COVER BACK TABLE 60X90IN (DRAPES) ×3 IMPLANT
COVER WAND RF STERILE (DRAPES) ×3 IMPLANT
DERMABOND ADVANCED (GAUZE/BANDAGES/DRESSINGS) ×2
DERMABOND ADVANCED .7 DNX12 (GAUZE/BANDAGES/DRESSINGS) ×1 IMPLANT
DRAPE C-ARM 42X72 X-RAY (DRAPES) IMPLANT
DRAPE C-ARMOR (DRAPES) IMPLANT
DRAPE LAPAROTOMY 100X72X124 (DRAPES) ×3 IMPLANT
DRAPE SURG 17X23 STRL (DRAPES) ×3 IMPLANT
DRSG OPSITE POSTOP 4X8 (GAUZE/BANDAGES/DRESSINGS) ×3 IMPLANT
DURAPREP 26ML APPLICATOR (WOUND CARE) ×3 IMPLANT
ELECT REM PT RETURN 9FT ADLT (ELECTROSURGICAL) ×3
ELECTRODE REM PT RTRN 9FT ADLT (ELECTROSURGICAL) ×1 IMPLANT
GAUZE 4X4 16PLY RFD (DISPOSABLE) IMPLANT
GAUZE SPONGE 4X4 12PLY STRL (GAUZE/BANDAGES/DRESSINGS) IMPLANT
GLOVE BIO SURGEON STRL SZ 6.5 (GLOVE) ×2 IMPLANT
GLOVE BIO SURGEON STRL SZ7 (GLOVE) ×3 IMPLANT
GLOVE BIO SURGEON STRL SZ7.5 (GLOVE) ×6 IMPLANT
GLOVE BIO SURGEONS STRL SZ 6.5 (GLOVE) ×1
GLOVE BIOGEL PI IND STRL 7.5 (GLOVE) ×3 IMPLANT
GLOVE BIOGEL PI IND STRL 8 (GLOVE) ×3 IMPLANT
GLOVE BIOGEL PI INDICATOR 7.5 (GLOVE) ×6
GLOVE BIOGEL PI INDICATOR 8 (GLOVE) ×6
GLOVE ECLIPSE 7.5 STRL STRAW (GLOVE) ×9 IMPLANT
GLOVE EXAM NITRILE LRG STRL (GLOVE) IMPLANT
GLOVE EXAM NITRILE XL STR (GLOVE) IMPLANT
GLOVE EXAM NITRILE XS STR PU (GLOVE) IMPLANT
GOWN STRL REUS W/ TWL LRG LVL3 (GOWN DISPOSABLE) ×2 IMPLANT
GOWN STRL REUS W/ TWL XL LVL3 (GOWN DISPOSABLE) ×1 IMPLANT
GOWN STRL REUS W/TWL 2XL LVL3 (GOWN DISPOSABLE) ×6 IMPLANT
GOWN STRL REUS W/TWL LRG LVL3 (GOWN DISPOSABLE) ×4
GOWN STRL REUS W/TWL XL LVL3 (GOWN DISPOSABLE) ×2
GRAFT BN 5X1XSPNE CVD POST DBM (Bone Implant) ×1 IMPLANT
GRAFT BONE MAGNIFUSE 1X5CM (Bone Implant) ×2 IMPLANT
HEMOSTAT POWDER KIT SURGIFOAM (HEMOSTASIS) ×3 IMPLANT
KIT BASIN OR (CUSTOM PROCEDURE TRAY) ×3 IMPLANT
KIT POSITION SURG JACKSON T1 (MISCELLANEOUS) ×3 IMPLANT
KIT TURNOVER KIT B (KITS) ×3 IMPLANT
MILL MEDIUM DISP (BLADE) ×3 IMPLANT
NEEDLE HYPO 18GX1.5 BLUNT FILL (NEEDLE) IMPLANT
NEEDLE HYPO 22GX1.5 SAFETY (NEEDLE) ×3 IMPLANT
NEEDLE SPNL 18GX3.5 QUINCKE PK (NEEDLE) IMPLANT
NS IRRIG 1000ML POUR BTL (IV SOLUTION) ×3 IMPLANT
PACK LAMINECTOMY NEURO (CUSTOM PROCEDURE TRAY) ×3 IMPLANT
PAD ARMBOARD 7.5X6 YLW CONV (MISCELLANEOUS) ×9 IMPLANT
ROD PED SOLERA 5.5X60 (Rod) ×6 IMPLANT
SCREW SET SOLERA (Screw) ×12 IMPLANT
SCREW SET SOLERA TI5.5 (Screw) ×6 IMPLANT
SCREW SOLERA 45X6.5XMA NS SPNE (Screw) ×4 IMPLANT
SCREW SOLERA 6.5X35MM (Screw) ×6 IMPLANT
SCREW SOLERA 6.5X45MM (Screw) ×8 IMPLANT
SPONGE LAP 4X18 RFD (DISPOSABLE) IMPLANT
SPONGE SURGIFOAM ABS GEL 100 (HEMOSTASIS) IMPLANT
STRIP CLOSURE SKIN 1/2X4 (GAUZE/BANDAGES/DRESSINGS) IMPLANT
SUT MNCRL AB 3-0 PS2 18 (SUTURE) ×3 IMPLANT
SUT VIC AB 0 CT1 18XCR BRD8 (SUTURE) ×1 IMPLANT
SUT VIC AB 0 CT1 8-18 (SUTURE) ×2
SUT VIC AB 2-0 CP2 18 (SUTURE) ×3 IMPLANT
SYR 3ML LL SCALE MARK (SYRINGE) IMPLANT
TOWEL GREEN STERILE (TOWEL DISPOSABLE) ×3 IMPLANT
TOWEL GREEN STERILE FF (TOWEL DISPOSABLE) ×3 IMPLANT
TRAY FOLEY MTR SLVR 16FR STAT (SET/KITS/TRAYS/PACK) ×3 IMPLANT
WATER STERILE IRR 1000ML POUR (IV SOLUTION) ×3 IMPLANT

## 2018-04-10 NOTE — Anesthesia Procedure Notes (Signed)
Procedure Name: Intubation Date/Time: 04/10/2018 5:35 PM Performed by: Oletta Lamas, CRNA Pre-anesthesia Checklist: Patient identified, Emergency Drugs available, Suction available and Patient being monitored Patient Re-evaluated:Patient Re-evaluated prior to induction Oxygen Delivery Method: Circle System Utilized Preoxygenation: Pre-oxygenation with 100% oxygen Induction Type: IV induction Ventilation: Mask ventilation without difficulty Laryngoscope Size: Miller and 3 Grade View: Grade I Tube type: Oral Tube size: 7.5 mm Number of attempts: 1 Airway Equipment and Method: Stylet and Oral airway Placement Confirmation: ETT inserted through vocal cords under direct vision,  positive ETCO2 and breath sounds checked- equal and bilateral Secured at: 22 cm Tube secured with: Tape Dental Injury: Teeth and Oropharynx as per pre-operative assessment

## 2018-04-10 NOTE — H&P (Signed)
Neurosurgery H&P  CC: Back pain  HPI: This is a 63 y.o. man that presents with severe back pain. He fell off a roof 3d ago and went to an outside ED. I was on call that evening and was called about it. I reviewed the imaging at that time and advised the ED physician that it was an unstable fracture and that the patient should be transferred for surgical treatment. She stated that she was concerned that the patient may leave AMA and the patient proceeded to do so. He has been laying on the couch at home for the past few days and called an ambulance to be transported to my office today. Upon seeing him in clinic, I transferred him to the hospital for surgery.  He currently denies new weakness, numbness, or parasthesias, no radicular pain, no recent change in bowel or bladder function. No recent use of anti-platelet or anti-coagulant medications. He just has severe back pain and muscle spasms whenever he moves in any way.    ROS: A 14 point ROS was performed and is negative except as noted in the HPI.   PMHx:  Past Medical History:  Diagnosis Date  . Anxiety   . Hypertension    FamHx: History reviewed. No pertinent family history. SocHx:  reports that he has quit smoking. He has quit using smokeless tobacco. He reports current alcohol use. He reports that he does not use drugs.  Exam: Vital signs in last 24 hours: Temp:  [97.8 F (36.6 C)] 97.8 F (36.6 C) (02/13 1431) Pulse Rate:  [70-72] 70 (02/13 1600) Resp:  [16-19] 19 (02/13 1600) BP: (129-150)/(90-99) 136/90 (02/13 1545) SpO2:  [96 %-97 %] 96 % (02/13 1515) Weight:  [85.7 kg] 85.7 kg (02/13 1430) General: Awake, alert, cooperative, lying in bed, appears very uncomfortable Head: normocephalic and atruamatic HEENT: neck supple Pulmonary: breathing room air comfortably, no evidence of increased work of breathing Cardiac: RRR Abdomen: S NT ND Extremities: warm and well perfused x4 Neuro: AOx3, PERRL, EOMI, FS Strength 5/5 x4,  SILTx4   Assessment and Plan: 63 y.o. man s/p fall off roof. CT L-spine personally reviewed, which shows L4 burst fracture with L4 laminar fracture, c/w 3 column injury.  -OR for instrumentation and fusion -4NP post-op  Judith Part, MD 04/10/18 5:14 PM [facility]

## 2018-04-10 NOTE — Brief Op Note (Signed)
04/10/2018  8:14 PM  PATIENT:  Brendan Sherman  63 y.o. male  PRE-OPERATIVE DIAGNOSIS:  Lumbar Four Fracture  POST-OPERATIVE DIAGNOSIS:  Lumbar Four Fracture  PROCEDURE:  Procedure(s): LUMBAR THREE-FIVE POSTERIOR LUMBAR INSTRUMENTED FUSION (N/A)  SURGEON:  Surgeon(s) and Role:    * Demauri Advincula, Joyice Faster, MD - Primary    * Erline Levine, MD - Assisting  PHYSICIAN ASSISTANT:   ANESTHESIA:   general  EBL:  350cc  BLOOD ADMINISTERED:none  DRAINS: none   LOCAL MEDICATIONS USED:  LIDOCAINE   SPECIMEN:  No Specimen  DISPOSITION OF SPECIMEN:  N/A  COUNTS:  YES  TOURNIQUET:  * No tourniquets in log *  DICTATION: .Note written in EPIC  PLAN OF CARE: Admit to inpatient   PATIENT DISPOSITION:  PACU - hemodynamically stable.   Delay start of Pharmacological VTE agent (>24hrs) due to surgical blood loss or risk of bleeding: yes

## 2018-04-10 NOTE — Op Note (Signed)
PATIENT: Brendan Sherman  DAY OF SURGERY: 04/10/18   PRE-OPERATIVE DIAGNOSIS:  Unstable L4 fracture   POST-OPERATIVE DIAGNOSIS:  Unstable L4 fracture   PROCEDURE:  L3-L5 posterior lumbar instrumented fusion   SURGEON:  Surgeon(s) and Role:    Judith Part, MD - Primary    Erline Levine, MD - Assisting   ANESTHESIA: ETGA   BRIEF HISTORY: This is a 63 year old man who fell off a roof and presented with severe back pain. A CT of the lumbar spine showed an L4 burst fracture with a laminar fracture at L4, consistent with a three column injury. This was discussed with the patient as well as risks, benefits, and alternatives and wished to proceed with surgical fixation.   OPERATIVE DETAIL: The patient was taken to the operating room and anesthesia was induced by the anesthesia team. They were placed on the OR table in the prone position with padding of all pressure points. A formal time out was performed with two patient identifiers and confirmed the operative site. The operative site was marked, hair was clipped with surgical clippers, the area was then prepped and draped in a sterile fashion. Fluoro was used to localize the operative level and a midline incision was placed to expose from L3 to L5. Subperiosteal dissection was performed bilaterally and fluoroscopy was again used to confirm the surgical level.   Instrumentation was then performed. Fluoroscopy was used to guide placement of bilateral pedicle screws (Medtronic) at L3, L4, and L5. These were placed by localizing the pedicle with standard landmarks, drilling a pilot hole, cannulating the pedicle with an awl, palpating for pedicle wall breaches, tapping, palpating, and then placing the screw. These were connected with rods bilaterally and final tightened according to manufacturer torque specifications. The bone was thoroughly decorticated over the fusion surface and the previously resected bone fragments were morselized and used as  autograft with the addition of magnafuse allograft (Medtronic). Given the morphology of the fracture, short pedicle screws were placed at L4 to provide some additional strength in the construct.   All instrument and sponge counts were correct, the incision was then closed in layers. The patient was then returned to anesthesia for emergence. No apparent complications at the completion of the procedure.   EBL:  374mL   DRAINS: none   SPECIMENS: none   Judith Part, MD 04/10/18 8:12 PM

## 2018-04-10 NOTE — ED Notes (Signed)
ED Provider at bedside. 

## 2018-04-10 NOTE — ED Provider Notes (Signed)
[place] EMERGENCY DEPARTMENT Provider Note   CSN: [id number] Arrival date & time: 04/10/18  1421  History   Chief Complaint Chief Complaint  Patient presents with  . Back Pain   HPI Brendan Sherman is a 63 y.o. male with past medical history significant for hypertension who presents for evaluation of back pain.  Patient states he fell off the roof of his house on Monday. Was seen by [facility] at that time.  Patient states he was told he had multiple fractures at L4 and L5 with impingement on cord.  Patient was to be transferred to [facility] emergency department for evaluation by neurosurgery at that time, however did not per family.  Patient was seen today by Dr. Zada Finders, neurosurgery.  Was sent to ED for evaluation for surgery. Patient states he has midline back pain located L4/L5 which he rates a 9/10.  Pain does not radiate.  States he is able to walk, however has pain.  Denies fever, chills, nausea, vomiting, chest pain, shortness of breath, headache, vision changes, midline neck pain, bowel or bladder incontinence, saddle paresthesia.  Has not taken anything for his pain. Denies numbness or tingling in his extremities.  Last p.o. intake 8 AM this morning.  History provided by patient.  No interpreter was used.  HPI  Past Medical History:  Diagnosis Date  . Anxiety   . Hypertension     There are no active problems to display for this patient.   Past Surgical History:  Procedure Laterality Date  . COLONOSCOPY N/A 10/09/2017   Procedure: COLONOSCOPY;  Surgeon: Daneil Dolin, MD;  Location: [facility] ENDO SUITE;  Service: Endoscopy;  Laterality: N/A;  7:30  . HERNIA REPAIR          Home Medications    Prior to Admission medications   Medication Sig Start Date End Date Taking? Authorizing Provider  diclofenac (VOLTAREN) 75 MG EC tablet Take 75 mg by mouth 2 (two) times daily. 03/22/18  Yes [provider]  escitalopram (LEXAPRO) 20 MG tablet Take  20 mg by mouth daily. 03/22/18  Yes [provider]  lisinopril (PRINIVIL,ZESTRIL) 10 MG tablet Take 10 mg by mouth daily. 03/22/18  Yes [provider]  omeprazole (PRILOSEC) 20 MG capsule Take 20 mg by mouth daily as needed (for acid reflux).  08/21/17  Yes [provider]  zolpidem (AMBIEN) 5 MG tablet Take 5 mg by mouth at bedtime. 08/27/17  Yes [provider]  hydrocortisone (ANUSOL-HC) 2.5 % rectal cream Place 1 application rectally 3 (three) times daily. Patient not taking: Reported on 04/10/2018 10/14/17   Daneil Dolin, MD  Na Sulfate-K Sulfate-Mg Sulf (SUPREP BOWEL PREP KIT) 17.5-3.13-1.6 GM/177ML SOLN Take 1 kit by mouth as directed. Patient not taking: Reported on 04/10/2018 09/09/17   Annitta Needs, NP    Family History No family history on file.  Social History Social History   Tobacco Use  . Smoking status: Former Research scientist (life sciences)  . Smokeless tobacco: Former Network engineer Use Topics  . Alcohol use: Yes    Comment: rarely  . Drug use: Never     Allergies   Patient has no known allergies.   Review of Systems Review of Systems  Constitutional: Negative.   HENT: Negative.   Respiratory: Negative.   Cardiovascular: Negative.   Gastrointestinal: Negative.   Genitourinary: Negative.   Musculoskeletal: Positive for back pain and gait problem.  Skin: Negative.   Neurological: Negative for dizziness, weakness, light-headedness  and numbness.  All other systems reviewed and are negative.    Physical Exam Updated Vital Signs BP 136/90   Pulse 70   Temp 97.8 F (36.6 C) (Oral)   Resp 19   Ht 5' 9"  (1.753 m)   Wt 85.7 kg   SpO2 96%   BMI 27.91 kg/m   Physical Exam Vitals signs and nursing note reviewed.  Constitutional:      General: He is not in acute distress.    Appearance: He is well-developed. He is not diaphoretic.  HENT:     Head: Atraumatic.     Nose: Nose normal.  Eyes:     Pupils: Pupils are equal, round, and reactive  to light.  Neck:     Musculoskeletal: Normal range of motion and neck supple.  Cardiovascular:     Rate and Rhythm: Normal rate and regular rhythm.     Pulses: Normal pulses.     Heart sounds: Normal heart sounds.     Comments: 2+ DP, PT pulses. Pulmonary:     Effort: Pulmonary effort is normal. No respiratory distress.     Comments: Clear to auscultation bilaterally without wheeze, rhonchi or rales.  No accessory muscle usage. Abdominal:     General: There is no distension.     Palpations: Abdomen is soft.     Comments: Soft, nontender without rebound or guarding.  Musculoskeletal: Normal range of motion.     Comments: Moves bilateral lower extremities without difficulty.  Tenderness to palpation over midline lumbar spine. No stepoffs. No tenderness to palpation of the spinous processes of the T-spine. Negative straight leg raise.  Skin:    General: Skin is warm and dry.     Comments: No rashes or lesions  Neurological:     Mental Status: He is alert.     Comments: Intact sensation to sharp and dull bilateral lower extremities.  5/5 strength bilateral lower extremities. Speech is clear and goal oriented, follows commands Normal 5/5 strength in upper and lower extremities bilaterally including dorsiflexion and plantar flexion, strong and equal grip strength Sensation normal to light and sharp touch Moves extremities without ataxia, coordination intact     ED Treatments / Results  Labs (all labs ordered are listed, but only abnormal results are displayed) Labs Reviewed  CBC WITH DIFFERENTIAL/PLATELET  COMPREHENSIVE METABOLIC PANEL  PROTIME-INR  TYPE AND SCREEN  ABO/RH    EKG EKG Interpretation  Date/Time:  Thursday April 10 2018 14:54:45 EST Ventricular Rate:  72 PR Interval:    QRS Duration: 90 QT Interval:  377 QTC Calculation: 413 R Axis:   -2 Text Interpretation:  Sinus rhythm Abnormal R-wave progression, early transition no prior to compare with Confirmed  by Aletta Edouard [phone]) on 04/10/2018 3:09:58 PM   Radiology No results found.  Procedures Procedures (including critical care time)  Medications Ordered in ED Medications  lactated ringers infusion (has no administration in time range)  HYDROmorphone (DILAUDID) injection 1 mg (1 mg Intravenous Given 04/10/18 1522)     Initial Impression / Assessment and Plan / ED Course  I have reviewed the triage vital signs and the nursing notes.  Pertinent labs & imaging results that were available during my care of the patient were reviewed by me and considered in my medical decision making (see chart for details).  63 year old male who appears otherwise well presents for evaluation of back pain.  Afebrile, nonseptic, non-ill-appearing. Tenderness to palpation over midline lumbar spine.  No bowel or bladder incontinence, saddle  paresthesia.  Neurovascularly intact. Patient with imaging CD here in department.   Records review from [facility] ED. Patient traumatic burst fractures, L4-L5.   Will obtain baseline labs, contact neurosurgery and reevaluate.  Clinical Course as of Apr 10 1641  Thu Feb 13, 644  [id number] 63 year old male status post fall on Monday saw neurosurgery today in the clinic and was referred here for likely operative repair.  He is complaining of moderate to severe low back pain with sometimes some radicular symptoms down the right leg.  No bowel or bladder incontinence.  He has been n.p.o. since about 8 this morning.  We are establishing IV and giving him some pain medicine and awaiting neurosurgery.   [MB]    Clinical Course User Index [MB] Hayden Rasmussen, MD   1530: CBC without leukocytosis metabolic panel without electrolyte, renal or liver abnormalities, EKG without ischemic changes. Will consult with neurosurgery for evaluation.   Consulted with Dr. Zada Finders, neurosurgery who agrees to admit patient. Do not need to repeat imaging at this time.  Hemodynamically  stable this time.  Seen and evaluated by my attending, Dr. Melina Copa.  He agrees above treatment, plan and disposition.  Final Clinical Impressions(s) / ED Diagnoses   Final diagnoses:  Closed fracture of fourth lumbar vertebra, unspecified fracture morphology, initial encounter [facility])  Closed fracture of fifth lumbar vertebra, unspecified fracture morphology, initial encounter [facility])    ED Discharge Orders    None       Sem Mccaughey A, PA-C 04/10/18 1643    Hayden Rasmussen, MD 04/10/18 1845

## 2018-04-10 NOTE — Anesthesia Postprocedure Evaluation (Signed)
Anesthesia Post Note  Patient: Brendan Sherman  Procedure(s) Performed: LUMBAR THREE-FIVE POSTERIOR LUMBAR INSTRUMENTED FUSION (N/A Spine Lumbar)     Patient location during evaluation: PACU Anesthesia Type: General Level of consciousness: awake Pain management: pain level controlled Vital Signs Assessment: post-procedure vital signs reviewed and stable Respiratory status: spontaneous breathing Cardiovascular status: stable Postop Assessment: no apparent nausea or vomiting Anesthetic complications: no    Last Vitals:  Vitals:   04/10/18 2130 04/10/18 2148  BP: 119/87 116/81  Pulse: 74 84  Resp: 16 17  Temp: 36.5 C 37.2 C  SpO2: 94%     Last Pain:  Vitals:   04/10/18 2100  TempSrc:   PainSc: 0-No pain                 Rukia Mcgillivray

## 2018-04-10 NOTE — Transfer of Care (Signed)
Immediate Anesthesia Transfer of Care Note  Patient: Brendan Sherman  Procedure(s) Performed: LUMBAR THREE-FIVE POSTERIOR LUMBAR INSTRUMENTED FUSION (N/A Spine Lumbar)  Patient Location: PACU  Anesthesia Type:General  Level of Consciousness: awake, drowsy and patient cooperative  Airway & Oxygen Therapy: Patient Spontanous Breathing and Patient connected to nasal cannula oxygen  Post-op Assessment: Report given to RN and Post -op Vital signs reviewed and stable  Post vital signs: Reviewed and stable  Last Vitals:  Vitals Value Taken Time  BP 119/76 04/10/2018  8:27 PM  Temp    Pulse 98 04/10/2018  8:27 PM  Resp 17 04/10/2018  8:27 PM  SpO2 90 % 04/10/2018  8:27 PM  Vitals shown include unvalidated device data.  Last Pain:  Vitals:   04/10/18 1629  TempSrc:   PainSc: 7          Complications: No apparent anesthesia complications

## 2018-04-10 NOTE — Anesthesia Preprocedure Evaluation (Signed)
Anesthesia Evaluation  Patient identified by MRN, date of birth, ID band Patient awake    Reviewed: Allergy & Precautions, H&P , NPO status , Patient's Chart, lab work & pertinent test results  Airway Mallampati: II   Neck ROM: full    Dental   Pulmonary former smoker,    breath sounds clear to auscultation       Cardiovascular hypertension,  Rhythm:regular Rate:Normal     Neuro/Psych PSYCHIATRIC DISORDERS Anxiety    GI/Hepatic   Endo/Other    Renal/GU      Musculoskeletal Lumbar spine fx s/p fall from roof.   Abdominal   Peds  Hematology   Anesthesia Other Findings   Reproductive/Obstetrics                             Anesthesia Physical Anesthesia Plan  ASA: II and emergent  Anesthesia Plan: General   Post-op Pain Management:    Induction: Intravenous  PONV Risk Score and Plan: 2 and Ondansetron, Dexamethasone, Midazolam and Treatment may vary due to age or medical condition  Airway Management Planned: Oral ETT  Additional Equipment:   Intra-op Plan:   Post-operative Plan: Extubation in OR  Informed Consent: I have reviewed the patients History and Physical, chart, labs and discussed the procedure including the risks, benefits and alternatives for the proposed anesthesia with the patient or authorized representative who has indicated his/her understanding and acceptance.       Plan Discussed with: CRNA, Anesthesiologist and Surgeon  Anesthesia Plan Comments:         Anesthesia Quick Evaluation

## 2018-04-10 NOTE — ED Triage Notes (Addendum)
Pt arrives via [facility] from [facility] for back pain and surgery. Pt fell of roof on Monday and was seen at [facility] and reports he has a L4/L5 fx, but they were unable to perform surgery for unknown reason. Per EMS, Zada Finders, MD performing surgery. Pt endorses last meal around 0830 today.

## 2018-04-11 ENCOUNTER — Other Ambulatory Visit: Payer: Self-pay

## 2018-04-11 ENCOUNTER — Encounter (HOSPITAL_COMMUNITY): Payer: Self-pay

## 2018-04-11 NOTE — Plan of Care (Signed)

## 2018-04-11 NOTE — Plan of Care (Signed)
  Problem: Education: Goal: Knowledge of General Education information will improve Description Including pain rating scale, medication(s)/side effects and non-pharmacologic comfort measures 04/11/2018 1521 by Jilda Roche, RN Outcome: Progressing 04/11/2018 1520 by Jilda Roche, RN Outcome: Progressing   Problem: Health Behavior/Discharge Planning: Goal: Ability to manage health-related needs will improve 04/11/2018 1521 by Jilda Roche, RN Outcome: Progressing 04/11/2018 1520 by Jilda Roche, RN Outcome: Progressing   Problem: Clinical Measurements: Goal: Ability to maintain clinical measurements within normal limits will improve 04/11/2018 1521 by Jilda Roche, RN Outcome: Progressing 04/11/2018 1520 by Jilda Roche, RN Outcome: Progressing Goal: Will remain free from infection 04/11/2018 1521 by Jilda Roche, RN Outcome: Progressing 04/11/2018 1520 by Jilda Roche, RN Outcome: Progressing Goal: Diagnostic test results will improve 04/11/2018 1521 by Jilda Roche, RN Outcome: Progressing 04/11/2018 1520 by Jilda Roche, RN Outcome: Progressing Goal: Cardiovascular complication will be avoided 04/11/2018 1521 by Jilda Roche, RN Outcome: Progressing 04/11/2018 1520 by Jilda Roche, RN Outcome: Progressing   Problem: Activity: Goal: Risk for activity intolerance will decrease 04/11/2018 1521 by Jilda Roche, RN Outcome: Progressing 04/11/2018 1520 by Jilda Roche, RN Outcome: Progressing   Problem: Nutrition: Goal: Adequate nutrition will be maintained 04/11/2018 1521 by Jilda Roche, RN Outcome: Progressing 04/11/2018 1520 by Jilda Roche, RN Outcome: Progressing   Problem: Coping: Goal: Level of anxiety will decrease 04/11/2018 1521 by Jilda Roche, RN Outcome:  Progressing 04/11/2018 1520 by Jilda Roche, RN Outcome: Progressing   Problem: Elimination: Goal: Will not experience complications related to bowel motility 04/11/2018 1521 by Jilda Roche, RN Outcome: Progressing 04/11/2018 1520 by Jilda Roche, RN Outcome: Progressing Goal: Will not experience complications related to urinary retention 04/11/2018 1521 by Jilda Roche, RN Outcome: Progressing 04/11/2018 1520 by Jilda Roche, RN Outcome: Progressing   Problem: Pain Managment: Goal: General experience of comfort will improve 04/11/2018 1521 by Jilda Roche, RN Outcome: Progressing 04/11/2018 1520 by Jilda Roche, RN Outcome: Progressing   Problem: Safety: Goal: Ability to remain free from injury will improve 04/11/2018 1521 by Jilda Roche, RN Outcome: Progressing 04/11/2018 1520 by Jilda Roche, RN Outcome: Progressing   Problem: Skin Integrity: Goal: Risk for impaired skin integrity will decrease 04/11/2018 1521 by Jilda Roche, RN Outcome: Progressing 04/11/2018 1520 by Jilda Roche, RN Outcome: Progressing

## 2018-04-11 NOTE — Evaluation (Signed)
Physical Therapy Evaluation Patient Details Name: Brendan Sherman MRN: [id number] DOB: [birth year] Today's Date: 04/11/2018   History of Present Illness  63 y.o. man that presents with severe back pain. He fell off a roof and went to an outside ED. Orthopedist advised the ED physician that it was an unstable fracture and that the patient should be transferred for surgical treatment. Pt left AMA, and 3 days later went to Orthopedic clinic via ambulance and was transferred to hospital for surgery. Pt s/p L3-L5 posterior lumbar instrumented fusion 04/10/18  Clinical Impression  PTA pt living in level entry home with bedroom and bath on the first level, independent in all aspects of mobility and iADLs. Pt currently limited in safe mobility by increased pain in back radiating down L LE with weightbearing. Pt is currently modA for bed mobility, transfers and ambulation of 6 feet with RW. Pt very discouraged by his mobility, and inability to overcome the pain in his back and L LE and attempted to stand again with therapy with similar results. RN present and administered pain medication. PT recommending HHPT level rehab at d/c as anticipate pt will have improved movement once pain is under control. PT will continue to follow acutely.     Follow Up Recommendations Home health PT;Supervision/Assistance - 24 hour    Equipment Recommendations  Rolling walker with 5" wheels;3in1 (PT)    Recommendations for Other Services       Precautions / Restrictions Precautions Precautions: Fall;Back Precaution Booklet Issued: Yes (comment) Precaution Comments: pt able to recall 3/3 back precautions at end of session  Restrictions Weight Bearing Restrictions: No      Mobility  Bed Mobility Overal bed mobility: Needs Assistance Bed Mobility: Rolling;Sidelying to Sit Rolling: Min assist Sidelying to sit: Mod assist       General bed mobility comments: min A for rolling into sidelying,   Transfers Overall transfer  level: Needs assistance Equipment used: Rolling walker (2 wheeled) Transfers: Sit to/from Stand Sit to Stand: Mod assist;+2 physical assistance         General transfer comment: modA for powerup and steadying from elevated bed surface, extreme L LE pain with weightbearing, modAx2 for powerup from recliner with return i  Ambulation/Gait Ambulation/Gait assistance: Mod assist Gait Distance (Feet): 6 Feet Assistive device: Rolling walker (2 wheeled) Gait Pattern/deviations: Step-to pattern;Decreased step length - right;Decreased stance time - left;Decreased weight shift to left;Antalgic;Trunk flexed Gait velocity: slowed Gait velocity interpretation: <1.8 ft/sec, indicate of risk for recurrent falls General Gait Details: modA for steadying with RW, pt with 10/10 pain in L LE with ambulation vc for sequencing and increased use of UE support to offweight L LE        Balance Overall balance assessment: Needs assistance Sitting-balance support: Feet supported;No upper extremity supported Sitting balance-Leahy Scale: Fair     Standing balance support: Single extremity supported Standing balance-Leahy Scale: Poor Standing balance comment: requires at least single UE support to maintain balance                             Pertinent Vitals/Pain Pain Assessment: 0-10 Pain Score: 9  Pain Location: back down L hip, thigh and shin with movement Pain Descriptors / Indicators: Burning;Shooting;Stabbing Pain Intervention(s): Limited activity within patient's tolerance;Monitored during session;Repositioned;RN gave pain meds during session    [place] expects to be discharged to:: Private residence Living Arrangements: Spouse/significant other Available Help at Discharge: Family;Available 24 hours/day Type  of Home: House Home Access: Level entry     Home Layout: Two level;Laundry or work area in [employer]: None      Prior Function Level of  Independence: Independent                  Extremity/Trunk Assessment   Upper Extremity Assessment Upper Extremity Assessment: Defer to OT evaluation    Lower Extremity Assessment Lower Extremity Assessment: LLE deficits/detail LLE Deficits / Details: L LE electric shooting pain from hip, down thigh and front of calf, with weightbearing, pt experiencing tremors in L quads after attempt at ambulation  LLE: Unable to fully assess due to pain LLE Sensation: WNL LLE Coordination: decreased fine motor    Cervical / Trunk Assessment Cervical / Trunk Assessment: Normal  Communication   Communication: No difficulties  Cognition Arousal/Alertness: Awake/alert Behavior During Therapy: WFL for tasks assessed/performed Overall Cognitive Status: Within Functional Limits for tasks assessed                                        General Comments General comments (skin integrity, edema, etc.): Pt wife in room throughout session, instrumental in keeping pt focused     Assessment/Plan    PT Assessment Patient needs continued PT services  PT Problem List Decreased strength;Decreased activity tolerance;Decreased balance;Decreased mobility;Pain       PT Treatment Interventions DME instruction;Gait training;Functional mobility training;Therapeutic activities;Therapeutic exercise;Balance training;Patient/family education    PT Goals (Current goals can be found in the Care Plan section)  Acute Rehab PT Goals Patient Stated Goal: go home PT Goal Formulation: With patient/family Time For Goal Achievement: 04/25/18 Potential to Achieve Goals: Fair    Frequency Min 5X/week    AM-PAC PT "6 Clicks" Mobility  Outcome Measure Help needed turning from your back to your side while in a flat bed without using bedrails?: A Little Help needed moving from lying on your back to sitting on the side of a flat bed without using bedrails?: A Lot Help needed moving to and from a bed  to a chair (including a wheelchair)?: A Lot Help needed standing up from a chair using your arms (e.g., wheelchair or bedside chair)?: A Little Help needed to walk in hospital room?: A Lot Help needed climbing 3-5 steps with a railing? : Total 6 Click Score: 13    End of Session   Activity Tolerance: Patient limited by pain Patient left: in chair;with call bell/phone within reach;with family/visitor present Nurse Communication: Mobility status;Patient requests pain meds;Precautions PT Visit Diagnosis: Other abnormalities of gait and mobility (R26.89);Muscle weakness (generalized) (M62.81);Difficulty in walking, not elsewhere classified (R26.2);Other symptoms and signs involving the nervous system (R29.898);Pain Pain - Right/Left: Left Pain - part of body: Leg    Time: 1020-1111 PT Time Calculation (min) (ACUTE ONLY): 51 min   Charges:   PT Evaluation $PT Eval Moderate Complexity: 1 Mod PT Treatments $Gait Training: 8-22 mins        Zeyad Delaguila B. Migdalia Dk PT, DPT Acute Rehabilitation Services Pager [phone] Office [phone]   [place] 04/11/2018, 1:51 PM

## 2018-04-11 NOTE — Plan of Care (Signed)
  Problem: Clinical Measurements: Goal: Ability to maintain clinical measurements within normal limits will improve Outcome: Progressing Goal: Will remain free from infection Outcome: Progressing Goal: Diagnostic test results will improve Outcome: Progressing Goal: Respiratory complications will improve Outcome: Not Applicable Goal: Cardiovascular complication will be avoided Outcome: Progressing   Problem: Activity: Goal: Risk for activity intolerance will decrease Outcome: Progressing   Problem: Nutrition: Goal: Adequate nutrition will be maintained Outcome: Progressing

## 2018-04-11 NOTE — Progress Notes (Signed)
Neurosurgery Service Progress Note  Subjective: No acute events overnight. Having some right hip pain, which he had preop due to tenderness where he struck the ground. No radicular symptoms, back pain is improved.   Objective: Vitals:   04/10/18 2125 04/10/18 2130 04/10/18 2148 04/11/18 0539  BP:  119/87 116/81 101/75  Pulse: 73 74 84 72  Resp: 15 16 17    Temp:  97.7 F (36.5 C) 99 F (37.2 C) 98.4 F (36.9 C)  TempSrc:    Oral  SpO2: 94% 94%  94%  Weight:      Height:       Temp (24hrs), Avg:98.2 F (36.8 C), Min:97.7 F (36.5 C), Max:99 F (37.2 C)  CBC Latest Ref Rng & Units 04/10/2018  WBC 4.0 - 10.5 K/uL 8.8  Hemoglobin 13.0 - 17.0 g/dL 16.8  Hematocrit 39.0 - 52.0 % 51.3  Platelets 150 - 400 K/uL 170   BMP Latest Ref Rng & Units 04/10/2018  Glucose 70 - 99 mg/dL 93  BUN 8 - 23 mg/dL 17  Creatinine 0.61 - 1.24 mg/dL 1.01  Sodium 135 - 145 mmol/L 140  Potassium 3.5 - 5.1 mmol/L 3.8  Chloride 98 - 111 mmol/L 103  CO2 22 - 32 mmol/L 24  Calcium 8.9 - 10.3 mg/dL 9.1    Intake/Output Summary (Last 24 hours) at 04/11/2018 0827 Last data filed at 04/11/2018 0400 Gross per 24 hour  Intake 2080 ml  Output 490 ml  Net 1590 ml    Current Facility-Administered Medications:  .  acetaminophen (TYLENOL) tablet 650 mg, 650 mg, Oral, Q4H PRN **OR** acetaminophen (TYLENOL) suppository 650 mg, 650 mg, Rectal, Q4H PRN, Sayuri Rhames A, MD .  ceFAZolin (ANCEF) IVPB 2g/100 mL premix, 2 g, Intravenous, Q8H, Sayana Salley, Joyice Faster, MD, Stopped at 04/11/18 0231 .  docusate sodium (COLACE) capsule 100 mg, 100 mg, Oral, BID, Judith Part, MD, 100 mg at 04/10/18 2303 .  escitalopram (LEXAPRO) tablet 20 mg, 20 mg, Oral, Daily, Paislee Szatkowski, Joyice Faster, MD .  Derrill Memo ON 04/12/2018] heparin injection 5,000 Units, 5,000 Units, Subcutaneous, Q8H, Cherlyn Syring A, MD .  HYDROmorphone (DILAUDID) injection 0.5 mg, 0.5 mg, Intravenous, Q3H PRN, Judith Part, MD .  labetalol  (NORMODYNE,TRANDATE) injection 10-40 mg, 10-40 mg, Intravenous, Q10 min PRN, Judith Part, MD .  lisinopril (PRINIVIL,ZESTRIL) tablet 10 mg, 10 mg, Oral, Daily, Drue Camera A, MD .  ondansetron (ZOFRAN) tablet 4 mg, 4 mg, Oral, Q4H PRN **OR** ondansetron (ZOFRAN) injection 4 mg, 4 mg, Intravenous, Q4H PRN, Donnivan Villena A, MD .  oxyCODONE (Oxy IR/ROXICODONE) 5 MG immediate release tablet, , , ,  .  oxyCODONE (Oxy IR/ROXICODONE) immediate release tablet 10 mg, 10 mg, Oral, Q4H PRN, Judith Part, MD, 10 mg at 04/11/18 5852 .  oxyCODONE (Oxy IR/ROXICODONE) immediate release tablet 5 mg, 5 mg, Oral, Q4H PRN, Tyrail Grandfield A, MD .  pantoprazole (PROTONIX) EC tablet 40 mg, 40 mg, Oral, Daily, Majour Frei A, MD .  polyethylene glycol (MIRALAX / GLYCOLAX) packet 17 g, 17 g, Oral, Daily PRN, Judith Part, MD .  promethazine (PHENERGAN) tablet 12.5-25 mg, 12.5-25 mg, Oral, Q4H PRN, Judith Part, MD .  zolpidem (AMBIEN) tablet 5 mg, 5 mg, Oral, QHS, Pio Eatherly, Joyice Faster, MD, 5 mg at 04/10/18 2303   Physical Exam: AOx3, PERRL, EOMI, FS, Strength 5/5 x4, SILTx4  Assessment & Plan: 63 y.o. man s/p L3-5 posterior instrumented fusion for unstable fracture, recovering well. -activity as tolerated, no brace -  f/u PT/OT recs -diet as tolerated -if able to ambulate well, tolerating regular diet, and pain well controlled, then he can be discharged this afternoon -SCDs/TEDs, SQH on POD2  Judith Part  04/11/18 8:27 AM

## 2018-04-11 NOTE — Evaluation (Signed)
Occupational Therapy Evaluation Patient Details Name: Brendan Sherman MRN: [id number] DOB: 07/28/1955 Today's Date: 04/11/2018    History of Present Illness 62 y.o. man that presents with severe back pain. He fell off a roof and went to an outside ED. Orthopedist advised the ED physician that it was an unstable fracture and that the patient should be transferred for surgical treatment. Pt left AMA, and 3 days later went to Orthopedic clinic via ambulance and was transferred to hospital for surgery. Pt s/p L3-L5 posterior lumbar instrumented fusion 04/10/18   Clinical Impression   PTA Pt independent in ADL and mobility. Pt is currently mod A +2 for transfers and short mobility. Limited by pain. Pt is max A for LB ADL and set up for seated grooming/eating. Pt requires skilled OT in the acute setting and afterwards at the [facility] level to maximize safety and independence in ADL and functional transfers.     Follow Up Recommendations  Home health OT    Equipment Recommendations  3 in 1 bedside commode    Recommendations for Other Services       Precautions / Restrictions Precautions Precautions: Fall;Back Precaution Booklet Issued: Yes (comment) Precaution Comments: pt able to recall 3/3 back precautions at end of session  Restrictions Weight Bearing Restrictions: No      Mobility Bed Mobility Overal bed mobility: Needs Assistance Bed Mobility: Rolling;Sidelying to Sit Rolling: Min assist Sidelying to sit: Mod assist       General bed mobility comments: min A for rolling into sidelying,   Transfers Overall transfer level: Needs assistance Equipment used: Rolling walker (2 wheeled) Transfers: Sit to/from Stand Sit to Stand: Mod assist;+2 physical assistance         General transfer comment: modA for powerup and steadying from elevated bed surface, extreme L LE pain with weightbearing, modAx2 for powerup from recliner with return i    Balance Overall balance assessment: Needs  assistance Sitting-balance support: Feet supported;No upper extremity supported Sitting balance-Leahy Scale: Fair     Standing balance support: Single extremity supported Standing balance-Leahy Scale: Poor Standing balance comment: requires at least single UE support to maintain balance                           ADL either performed or assessed with clinical judgement   ADL Overall ADL's : Needs assistance/impaired Eating/Feeding: Modified independent   Grooming: Set up;Sitting;Wash/dry face Grooming Details (indicate cue type and reason): unable to maintain standing for grooming tasks Upper Body Bathing: Moderate assistance   Lower Body Bathing: Moderate assistance   Upper Body Dressing : Minimal assistance   Lower Body Dressing: Maximal assistance   Toilet Transfer: Moderate assistance;+2 for physical assistance;+2 for safety/equipment;Ambulation;RW;BSC(less than 3 feet before stating "I cannot go on!")   Toileting- Clothing Manipulation and Hygiene: Maximal assistance;+2 for physical assistance;+2 for safety/equipment;Sit to/from stand Toileting - Clothing Manipulation Details (indicate cue type and reason): one person to assist with stand, one to perform pero care     Functional mobility during ADLs: Moderate assistance;+2 for safety/equipment;Rolling walker;Cueing for sequencing General ADL Comments: Decreased activity tolerance, decreased access to LB for ADL     Vision         Perception     Praxis      Pertinent Vitals/Pain Pain Assessment: 0-10 Pain Score: 9  Pain Location: back down L hip, thigh and shin with movement Pain Descriptors / Indicators: Burning;Shooting;Stabbing Pain Intervention(s): Monitored during session;Limited activity within  patient's tolerance;Repositioned     Hand Dominance     Extremity/Trunk Assessment Upper Extremity Assessment Upper Extremity Assessment: Overall WFL for tasks assessed   Lower Extremity  Assessment Lower Extremity Assessment: Defer to PT evaluation LLE Deficits / Details: L LE electric shooting pain from hip, down thigh and front of calf, with weightbearing, pt experiencing tremors in L quads after attempt at ambulation  LLE: Unable to fully assess due to pain LLE Sensation: WNL LLE Coordination: decreased fine motor   Cervical / Trunk Assessment Cervical / Trunk Assessment: Normal   Communication Communication Communication: No difficulties   Cognition Arousal/Alertness: Awake/alert Behavior During Therapy: WFL for tasks assessed/performed Overall Cognitive Status: Within Functional Limits for tasks assessed                                     General Comments  Pt wife in room throughout session, instrumental in keeping pt focused    Exercises     Shoulder Instructions      Home Living Family/patient expects to be discharged to:: Private residence Living Arrangements: Spouse/significant other Available Help at Discharge: Family;Available 24 hours/day Type of Home: House Home Access: Level entry     Home Layout: Two level;Laundry or work area in basement     [employer] Shower/Tub: Teacher, early years/pre: Handicapped height Bathroom Accessibility: Yes   Home Equipment: None          Prior Functioning/Environment Level of Independence: Independent        Comments: works as a Librarian, academic in [employer] Problem List: Decreased range of motion;Decreased activity tolerance;Decreased knowledge of precautions;Pain      OT Treatment/Interventions: Field seismologist;Therapeutic exercise;DME and/or AE instruction;Therapeutic activities;Patient/family education;Balance training    OT Goals(Current goals can be found in the care plan section) Acute Rehab OT Goals Patient Stated Goal: go home OT Goal Formulation: With patient/family Time For Goal Achievement: 04/25/18 Potential to Achieve Goals:  Good ADL Goals Pt Will Perform Lower Body Bathing: with supervision;with adaptive equipment;sit to/from stand Pt Will Perform Lower Body Dressing: with min guard assist;with caregiver independent in assisting;sit to/from stand Pt Will Transfer to Toilet: with supervision;ambulating Pt Will Perform Toileting - Clothing Manipulation and hygiene: with modified independence;sit to/from stand Additional ADL Goal #1: Pt will perform bed mobility at mod I prior to engaging in ADL  OT Frequency: Min 3X/week   Barriers to D/C:            Co-evaluation PT/OT/SLP Co-Evaluation/Treatment: Yes Reason for Co-Treatment: For patient/therapist safety;To address functional/ADL transfers PT goals addressed during session: Mobility/safety with mobility;Balance;Proper use of DME OT goals addressed during session: ADL's and self-care      AM-PAC OT "6 Clicks" Daily Activity     Outcome Measure Help from another person eating meals?: None Help from another person taking care of personal grooming?: A Little(in sitting) Help from another person toileting, which includes using toliet, bedpan, or urinal?: A Lot Help from another person bathing (including washing, rinsing, drying)?: A Lot Help from another person to put on and taking off regular upper body clothing?: A Lot Help from another person to put on and taking off regular lower body clothing?: A Lot 6 Click Score: 15   End of Session Equipment Utilized During Treatment: Gait belt;Rolling walker Nurse Communication: Mobility status;Patient requests pain meds;Precautions  Activity Tolerance: Patient limited by pain  Patient left: in chair;with call bell/phone within reach;with family/visitor present  OT Visit Diagnosis: Unsteadiness on feet (R26.81);Other abnormalities of gait and mobility (R26.89);Pain Pain - Right/Left: Left Pain - part of body: Leg(back at incision)                Time: 0932-6712 OT Time Calculation (min): 38 min Charges:  OT  General Charges $OT Visit: 1 Visit OT Evaluation $OT Eval Moderate Complexity: 1 Mod OT Treatments $Self Care/Home Management : 8-22 mins  Hulda Humphrey OTR/L Acute Rehabilitation Services Pager: [phone] Office: [place] 04/11/2018, 4:25 PM

## 2018-04-12 MED ORDER — DOCUSATE SODIUM 100 MG PO CAPS: 100.0000 mg | ORAL_CAPSULE | Freq: Two times a day (BID) | ORAL | 0 refills | Status: DC

## 2018-04-12 MED ORDER — OXYCODONE HCL 5 MG PO TABS: 5.0000 mg | ORAL_TABLET | ORAL | 0 refills | Status: DC | PRN

## 2018-04-12 NOTE — Progress Notes (Signed)
Occupational Therapy Treatment Patient Details Name: Brendan Sherman MRN: [id number] DOB: 1955-12-19 Today's Date: 04/12/2018    History of present illness 63 y.o. man that presents with severe back pain. He fell off a roof and went to an outside ED. Orthopedist advised the ED physician that it was an unstable fracture and that the patient should be transferred for surgical treatment. Pt left AMA, and 3 days later went to Orthopedic clinic via ambulance and was transferred to hospital for surgery. Pt s/p L3-L5 posterior lumbar instrumented fusion 04/10/18   OT comments  Requested by RN Staff to assist Pt back to bed from chair using STEDY. Pt able to perform mod A +2 assist. Severe pain and shaking in LLE with movement and WB. OT will continue to follow acutely - and next session will need to focus on transfers with RW from typical surfaces (how high his bed is at home, bed mobility from flat bed) and education/practice for wife with LB ADL. OT will continue to follow   Follow Up Recommendations  Home health OT;Other (comment)(monitor next session for SNF needs?)    Equipment Recommendations  3 in 1 bedside commode    Recommendations for Other Services      Precautions / Restrictions Precautions Precautions: Fall;Back Precaution Booklet Issued: Yes (comment) Precaution Comments: reviewed 3/3 precautions with wife present Restrictions Weight Bearing Restrictions: No       Mobility Bed Mobility Overal bed mobility: Needs Assistance Bed Mobility: Rolling;Sit to Sidelying Rolling: Min guard Sidelying to sit: Mod assist;+2 for physical assistance;+2 for safety/equipment;HOB elevated     Sit to sidelying: Mod assist;+2 for safety/equipment General bed mobility comments: assist for BLE back into bed, vc for sequencing for bed mobility maintaining precautions  Transfers Overall transfer level: Needs assistance   Transfers: Sit to/from Stand Sit to Stand: Mod assist;+2 physical  assistance         General transfer comment: modA for powerup and steadying, extreme L LE pain with weightbearing, modAx2 for controlled descent onto elevated bed. VC for hand placement throughout    Balance Overall balance assessment: Needs assistance Sitting-balance support: Feet supported;No upper extremity supported Sitting balance-Leahy Scale: Fair Sitting balance - Comments: initially max A able to progress to min guard - very bad tremors in BLE with sitting EOB   Standing balance support: Bilateral upper extremity supported Standing balance-Leahy Scale: Poor Standing balance comment: able to perform front peri care with one hand supporting in standing. Able to stand in steady for ~10 min.                           ADL either performed or assessed with clinical judgement   ADL Overall ADL's : Needs assistance/impaired     Grooming: Wash/dry face;Oral care;Set up;Sitting Grooming Details (indicate cue type and reason): on BSC Upper Body Bathing: Moderate assistance Upper Body Bathing Details (indicate cue type and reason): for back while sitting             Toilet Transfer: Moderate assistance;+2 for physical assistance;+2 for safety/equipment;BSC Toilet Transfer Details (indicate cue type and reason): use of stedy today as Pt unable to perform transfer without pulling up Toileting- Clothing Manipulation and Hygiene: Maximal assistance;+2 for physical assistance;+2 for safety/equipment;Sit to/from stand Toileting - Clothing Manipulation Details (indicate cue type and reason): one person to assist with stand, one to perform peri care     Functional mobility during ADLs: (required use of stedy for transfers today)  General ADL Comments: Pt only able to perform transfers with STEDY this session     Vision       Perception     Praxis      Cognition Arousal/Alertness: Awake/alert Behavior During Therapy: Anxious Overall Cognitive Status: Within  Functional Limits for tasks assessed                                          Exercises     Shoulder Instructions       General Comments wife present throughout session    Pertinent Vitals/ Pain       Pain Assessment: 0-10 Pain Score: 9  Pain Location: back down L hip, thigh and shin with movement Pain Descriptors / Indicators: Burning;Shooting;Stabbing Pain Intervention(s): Limited activity within patient's tolerance;Monitored during session;Repositioned  Home Living                                          Prior Functioning/Environment              Frequency  Min 3X/week        Progress Toward Goals  OT Goals(current goals can now be found in the care plan section)  Progress towards OT goals: Not progressing toward goals - comment(limited by pain)  Acute Rehab OT Goals Patient Stated Goal: go home OT Goal Formulation: With patient/family Time For Goal Achievement: 04/25/18 Potential to Achieve Goals: Good  Plan Discharge plan remains appropriate(monitor for pain - may need SNF if not able to transfer)    Co-evaluation    PT/OT/SLP Co-Evaluation/Treatment: Yes Reason for Co-Treatment: For patient/therapist safety;To address functional/ADL transfers PT goals addressed during session: Mobility/safety with mobility OT goals addressed during session: ADL's and self-care      AM-PAC OT "6 Clicks" Daily Activity     Outcome Measure   Help from another person eating meals?: None Help from another person taking care of personal grooming?: A Little Help from another person toileting, which includes using toliet, bedpan, or urinal?: A Lot Help from another person bathing (including washing, rinsing, drying)?: A Lot Help from another person to put on and taking off regular upper body clothing?: A Lot Help from another person to put on and taking off regular lower body clothing?: A Lot 6 Click Score: 15    End of Session  Equipment Utilized During Treatment: Gait belt;Other (comment)(stedy)  OT Visit Diagnosis: Unsteadiness on feet (R26.81);Other abnormalities of gait and mobility (R26.89);Pain Pain - Right/Left: Left Pain - part of body: Leg(and incision site)   Activity Tolerance Patient limited by pain   Patient Left in bed;with call bell/phone within reach;with bed alarm set;with family/visitor present   Nurse Communication Mobility status;Precautions        Time: 1040-1056 OT Time Calculation (min): 16 min  Charges: OT General Charges $OT Visit: 1 Visit OT Treatments $Self Care/Home Management : 8-22 mins $Therapeutic Activity: 8-22 mins  Hulda Humphrey OTR/L Acute Rehabilitation Services Pager: [phone] Office: [place] 04/12/2018, 12:45 PM

## 2018-04-12 NOTE — Progress Notes (Signed)
Occupational Therapy Treatment Patient Details Name: Brendan Sherman MRN: [id number] DOB: [birth year] Today's Date: 04/12/2018    History of present illness 63 y.o. man that presents with severe back pain. He fell off a roof and went to an outside ED. Orthopedist advised the ED physician that it was an unstable fracture and that the patient should be transferred for surgical treatment. Pt left AMA, and 3 days later went to Orthopedic clinic via ambulance and was transferred to hospital for surgery. Pt s/p L3-L5 posterior lumbar instrumented fusion 04/10/18   OT comments  Pt not progressing towards OT goals this session. Pt limited by pain in BLE and tremor/jumping in legs requiring the use of STEDY lift equipment. Pt able to perform sit<>stand with stedy for [facility] transfer (Pt able to perform grooming on BSC) and able to perform brief single arm standing for front peri care. At this time, Pt continues to require skilled OT acutely and will require HHOT - if he cannot progress with transfers will need SNF level care (to be assessed next session). Next session will need to focus on transfers and LB ADL education with wife present.  Follow Up Recommendations  Home health OT    Equipment Recommendations  3 in 1 bedside commode    Recommendations for Other Services      Precautions / Restrictions Precautions Precautions: Fall;Back Precaution Booklet Issued: Yes (comment) Precaution Comments: Pt able to recall 1/3 precautions - re-educated Restrictions Weight Bearing Restrictions: No       Mobility Bed Mobility Overal bed mobility: Needs Assistance Bed Mobility: Rolling;Sidelying to Sit Rolling: Mod assist Sidelying to sit: Mod assist;+2 for physical assistance;+2 for safety/equipment       General bed mobility comments: min A for rolling into sidelying, mod A for boost with one therapist assist with bed pad   Transfers Overall transfer level: Needs assistance   Transfers: Sit to/from  Stand Sit to Stand: Mod assist;+2 physical assistance         General transfer comment: modA for powerup and steadying from elevated bed surface, extreme L LE pain with weightbearing, modAx2 for power up    Balance Overall balance assessment: Needs assistance Sitting-balance support: Feet supported;No upper extremity supported Sitting balance-Leahy Scale: Poor Sitting balance - Comments: initially MAx A able to progress to min guard - very bad tremors in BLE with sitting EOB   Standing balance support: Bilateral upper extremity supported Standing balance-Leahy Scale: Poor Standing balance comment: able to perform front peri care with one hand supporting in standing                           ADL either performed or assessed with clinical judgement   ADL Overall ADL's : Needs assistance/impaired     Grooming: Wash/dry face;Oral care;Set up;Sitting Grooming Details (indicate cue type and reason): on BSC Upper Body Bathing: Moderate assistance Upper Body Bathing Details (indicate cue type and reason): for back while sitting             Toilet Transfer: Moderate assistance;+2 for physical assistance;+2 for safety/equipment;BSC(STEDY) Toilet Transfer Details (indicate cue type and reason): use of stedy today as Pt unable to perform transfer without pulling up Toileting- Clothing Manipulation and Hygiene: Maximal assistance;+2 for physical assistance;+2 for safety/equipment;Sit to/from stand Toileting - Clothing Manipulation Details (indicate cue type and reason): one person to assist with stand, one to perform peri care     Functional mobility during ADLs: Moderate assistance;+2 for  safety/equipment;Cueing for sequencing General ADL Comments: Pt only able to perform transfers with STEDY this session     Vision       Perception     Praxis      Cognition Arousal/Alertness: Awake/alert Behavior During Therapy: Anxious Overall Cognitive Status: Within  Functional Limits for tasks assessed                                          Exercises     Shoulder Instructions       General Comments      Pertinent Vitals/ Pain       Pain Assessment: 0-10 Pain Score: 9  Pain Location: back down R hip, thigh and shin with movement Pain Descriptors / Indicators: Burning;Shooting;Stabbing Pain Intervention(s): Monitored during session;Premedicated before session;Repositioned;Limited activity within patient's tolerance  Home Living                                          Prior Functioning/Environment              Frequency  Min 3X/week        Progress Toward Goals  OT Goals(current goals can now be found in the care plan section)  Progress towards OT goals: Not progressing toward goals - comment(decrease in ability to perform transfers this session )  Acute Rehab OT Goals Patient Stated Goal: go home OT Goal Formulation: With patient/family Time For Goal Achievement: 04/25/18 Potential to Achieve Goals: Good  Plan Discharge plan remains appropriate(if pain not controlled next session, will need SNF)    Co-evaluation    PT/OT/SLP Co-Evaluation/Treatment: Yes Reason for Co-Treatment: For patient/therapist safety;To address functional/ADL transfers PT goals addressed during session: Mobility/safety with mobility;Balance;Proper use of DME;Strengthening/ROM OT goals addressed during session: ADL's and self-care;Strengthening/ROM      AM-PAC OT "6 Clicks" Daily Activity     Outcome Measure   Help from another person eating meals?: None Help from another person taking care of personal grooming?: A Little Help from another person toileting, which includes using toliet, bedpan, or urinal?: A Lot Help from another person bathing (including washing, rinsing, drying)?: A Lot Help from another person to put on and taking off regular upper body clothing?: A Lot Help from another person to put on  and taking off regular lower body clothing?: A Lot 6 Click Score: 15    End of Session Equipment Utilized During Treatment: Gait belt;Other (comment)(STEDY)  OT Visit Diagnosis: Unsteadiness on feet (R26.81);Other abnormalities of gait and mobility (R26.89);Pain Pain - Right/Left: Left Pain - part of body: Leg(and back at incision)   Activity Tolerance Patient limited by pain   Patient Left in chair;with call bell/phone within reach   Nurse Communication Mobility status;Precautions        Time: 3817-7116 OT Time Calculation (min): 60 min  Charges: OT General Charges $OT Visit: 1 Visit OT Treatments $Self Care/Home Management : 8-22 mins $Therapeutic Activity: 8-22 mins  Brendan Sherman OTR/L Acute Rehabilitation Services Pager: [phone] Office: [phone]    Merri Ray Haadi Santellan 04/12/2018, 10:57 AM

## 2018-04-12 NOTE — Discharge Instructions (Signed)
Discharge Instructions  No restriction in activities, slowly increase your activity back to normal.   Your incision is closed with staples. These will be removed at your 2 week post-op visit.  Okay to shower on the day of discharge. Use regular soap and water and try to be gentle when cleaning your incision.   Follow up with Dr. Zada Finders in 2 weeks after discharge. If you do not already have a discharge appointment, please call his office at [phone] to schedule a follow up appointment. If you have any concerns or questions, please call the office and let us know.

## 2018-04-12 NOTE — Discharge Summary (Addendum)
Discharge Summary  Date of Admission: 04/10/2018  Date of Discharge: 04/12/18  Attending Physician: Emelda Brothers, MD  Hospital Course: Patient was admitted for an unstable L4 fracture that occurred after a fall off a roof. He was taken to the OR for L3-L5 posterior instrumented fusion. He was recovered in PACU and transferred to a regular nursing floor. PT/OT evaluated the patient and recommended SNF placement. His hospital course was uncomplicated and the patient was discharged to SNF on 04/16/2018. He will follow up in clinic with me in 2 weeks.   Neurologic exam at discharge:  AOx3, PERRL, EOMI, FS, TM Strength 5/5 x4, SILTx4  Discharge diagnosis: Unstable L4 lumbar burst fracture and L4 laminar fracture  Discharge medications: Allergies as of 04/16/2018   No Known Allergies     Medication List    TAKE these medications   cyclobenzaprine 10 MG tablet Commonly known as:  FLEXERIL Take 1 tablet (10 mg total) by mouth 3 (three) times daily as needed for muscle spasms.   diclofenac 75 MG EC tablet Commonly known as:  VOLTAREN Take 75 mg by mouth 2 (two) times daily.   docusate sodium 100 MG capsule Commonly known as:  COLACE Take 1 capsule (100 mg total) by mouth 2 (two) times daily.   escitalopram 20 MG tablet Commonly known as:  LEXAPRO Take 20 mg by mouth daily.   hydrocortisone 2.5 % rectal cream Commonly known as:  ANUSOL-HC Place 1 application rectally 3 (three) times daily.   lisinopril 10 MG tablet Commonly known as:  PRINIVIL,ZESTRIL Take 10 mg by mouth daily.   Na Sulfate-K Sulfate-Mg Sulf 17.5-3.13-1.6 GM/177ML Soln Commonly known as:  SUPREP BOWEL PREP KIT Take 1 kit by mouth as directed.   omeprazole 20 MG capsule Commonly known as:  PRILOSEC Take 20 mg by mouth daily as needed (for acid reflux).   oxyCODONE 5 MG immediate release tablet Commonly known as:  Oxy IR/ROXICODONE Take 1 tablet (5 mg total) by mouth every 4 (four) hours as needed  (pain).   zolpidem 5 MG tablet Commonly known as:  AMBIEN Take 5 mg by mouth at bedtime.            Durable Medical Equipment  (From admission, onward)         Start     Ordered   04/12/18 1232  For home use only DME Walker rolling  Once    Question:  Patient needs a walker to treat with the following condition  Answer:  Weakness   04/12/18 1232   04/12/18 1232  For home use only DME 3 n 1  Once     04/12/18 [place], MD 04/12/18 10:14 AM

## 2018-04-12 NOTE — Progress Notes (Signed)
Physical Therapy Treatment Patient Details Name: Brendan Sherman MRN: [id number] DOB: 04/27/1955 Today's Date: 04/12/2018    History of Present Illness 63 y.o. man that presents with severe back pain. He fell off a roof and went to an outside ED. Orthopedist advised the ED physician that it was an unstable fracture and that the patient should be transferred for surgical treatment. Pt left AMA, and 3 days later went to Orthopedic clinic via ambulance and was transferred to hospital for surgery. Pt s/p L3-L5 posterior lumbar instrumented fusion 04/10/18    PT Comments    Pt continues to be limited by pain. At EOB pt c/o BIL LE pain and tremmors. Pt required mod A +2 with stedy to progress OOB to recliner chair. Once standing in stedy pt able to maintain standing with UE support for ~10 min. He transferred to [facility] and recliner chair. Plan to attempt transfers with RW next session. If pt is unable to progress to RW may need to consider updating d/c plan to SNF for pt safety and to maximize functional independence.    Follow Up Recommendations  Home health PT;Supervision/Assistance - 24 hour     Equipment Recommendations  Rolling walker with 5" wheels;3in1 (PT)    Recommendations for Other Services       Precautions / Restrictions Precautions Precautions: Fall;Back Precaution Booklet Issued: Yes (comment) Precaution Comments: Pt able to recall 1/3 precautions - re-educated Restrictions Weight Bearing Restrictions: No    Mobility  Bed Mobility Overal bed mobility: Needs Assistance Bed Mobility: Rolling;Sidelying to Sit Rolling: Mod assist Sidelying to sit: Mod assist;+2 for physical assistance;+2 for safety/equipment;HOB elevated       General bed mobility comments: mod A for rolling into sidelying, mod A for boost with one therapist assist with bed pad   Transfers Overall transfer level: Needs assistance   Transfers: Sit to/from Stand Sit to Stand: Mod assist;+2 physical assistance          General transfer comment: modA for powerup and steadying from elevated bed surface, extreme L LE pain with weightbearing, modAx2 for controlled descent into chair and BSC. VC for hand placement throughout  Ambulation/Gait                 Stairs             Wheelchair Mobility    Modified Rankin (Stroke Patients Only)       Balance Overall balance assessment: Needs assistance Sitting-balance support: Feet supported;No upper extremity supported Sitting balance-Leahy Scale: Poor Sitting balance - Comments: initially max A able to progress to min guard - very bad tremors in BLE with sitting EOB   Standing balance support: Bilateral upper extremity supported Standing balance-Leahy Scale: Poor Standing balance comment: able to perform front peri care with one hand supporting in standing. Able to stand in steady for ~10 min.                            Cognition Arousal/Alertness: Awake/alert Behavior During Therapy: Anxious Overall Cognitive Status: Within Functional Limits for tasks assessed                                        Exercises      General Comments General comments (skin integrity, edema, etc.): MD removed surgical bandage during treatment.      Pertinent Vitals/Pain Pain Assessment:  0-10 Pain Score: 9  Pain Location: back down R hip, thigh and shin with movement Pain Descriptors / Indicators: Burning;Shooting;Stabbing Pain Intervention(s): Monitored during session    Home Living                      Prior Function            PT Goals (current goals can now be found in the care plan section) Acute Rehab PT Goals Patient Stated Goal: go home PT Goal Formulation: With patient/family Time For Goal Achievement: 04/25/18 Potential to Achieve Goals: Fair Progress towards PT goals: Not progressing toward goals - comment(limited by pain)    Frequency    Min 5X/week      PT Plan Current plan  remains appropriate    Co-evaluation   Reason for Co-Treatment: For patient/therapist safety;To address functional/ADL transfers PT goals addressed during session: Mobility/safety with mobility OT goals addressed during session: ADL's and self-care      AM-PAC PT "6 Clicks" Mobility   Outcome Measure  Help needed turning from your back to your side while in a flat bed without using bedrails?: A Lot Help needed moving from lying on your back to sitting on the side of a flat bed without using bedrails?: A Lot Help needed moving to and from a bed to a chair (including a wheelchair)?: A Lot Help needed standing up from a chair using your arms (e.g., wheelchair or bedside chair)?: A Lot Help needed to walk in hospital room?: Total Help needed climbing 3-5 steps with a railing? : Total 6 Click Score: 10    End of Session Equipment Utilized During Treatment: Gait belt Activity Tolerance: Patient limited by pain Patient left: in chair;with call bell/phone within reach;with family/visitor present Nurse Communication: Mobility status;Precautions PT Visit Diagnosis: Other abnormalities of gait and mobility (R26.89);Muscle weakness (generalized) (M62.81);Difficulty in walking, not elsewhere classified (R26.2);Other symptoms and signs involving the nervous system (R29.898);Pain Pain - Right/Left: Left Pain - part of body: Leg     Time: 0912-1012 PT Time Calculation (min) (ACUTE ONLY): 60 min  Charges:  $Therapeutic Activity: 23-37 mins                     Benjiman Core, [place] Pager [phone] Acute Rehab   Allena Katz 04/12/2018, 11:25 AM

## 2018-04-12 NOTE — Care Management Note (Addendum)
Case Management Note  Patient Details  Name: Brendan Sherman MRN: [id number] Date of Birth: [birth year]  Subjective/Objective:        Pt to return home with wife to assist.  Wife is physically limited and may not be able to assist patient at the level he requires today.  Pt and wife feel he is not ready to leave.  RN aware and will clarify discharge plan with MD.  Pt needs 3n1 and RW.             Action/Plan: Medicare list presented to patient and wife.  They choose [facility].  Referral called to [facility] with [place] for [facility] PT and 3n1, RW.  DME will be delivered to patient's room today.   Expected Discharge Date:  04/12/18               Expected Discharge Plan:  [place]  In-House Referral:  NA  Discharge planning Services  CM Consult  Post Acute Care Choice:  Home Health, Durable Medical Equipment Choice offered to:  Patient  DME Arranged:  3-N-1, Walker rolling DME Agency:  [place]:  PT [place]:  [place]  Status of Service:  In process, will continue to follow  If discussed at Long Length of Stay Meetings, dates discussed:    Additional Comments:  Claudie Leach, RN 04/12/2018, 1:58 PM

## 2018-04-12 NOTE — Progress Notes (Signed)
Neurosurgery Service Progress Note  Subjective: No acute events overnight, still having soreness along the right hip   Objective: Vitals:   04/11/18 0539 04/11/18 1330 04/11/18 2055 04/12/18 0346  BP: 101/75 114/69 112/76 106/71  Pulse: 72 91 80 83  Resp:  19 16   Temp: 98.4 F (36.9 C) 97.6 F (36.4 C) (!) 97.5 F (36.4 C) 98.7 F (37.1 C)  TempSrc: Oral Oral Oral   SpO2: 94% 95% 95% 96%  Weight:      Height:       Temp (24hrs), Avg:97.9 F (36.6 C), Min:97.5 F (36.4 C), Max:98.7 F (37.1 C)  CBC Latest Ref Rng & Units 04/10/2018  WBC 4.0 - 10.5 K/uL 8.8  Hemoglobin 13.0 - 17.0 g/dL 16.8  Hematocrit 39.0 - 52.0 % 51.3  Platelets 150 - 400 K/uL 170   BMP Latest Ref Rng & Units 04/10/2018  Glucose 70 - 99 mg/dL 93  BUN 8 - 23 mg/dL 17  Creatinine 0.61 - 1.24 mg/dL 1.01  Sodium 135 - 145 mmol/L 140  Potassium 3.5 - 5.1 mmol/L 3.8  Chloride 98 - 111 mmol/L 103  CO2 22 - 32 mmol/L 24  Calcium 8.9 - 10.3 mg/dL 9.1    Intake/Output Summary (Last 24 hours) at 04/12/2018 1005 Last data filed at 04/11/2018 1917 Gross per 24 hour  Intake -  Output 200 ml  Net -200 ml    Current Facility-Administered Medications:  .  acetaminophen (TYLENOL) tablet 650 mg, 650 mg, Oral, Q4H PRN **OR** acetaminophen (TYLENOL) suppository 650 mg, 650 mg, Rectal, Q4H PRN, Haroun Cotham A, MD .  docusate sodium (COLACE) capsule 100 mg, 100 mg, Oral, BID, Judith Part, MD, 100 mg at 04/12/18 0917 .  escitalopram (LEXAPRO) tablet 20 mg, 20 mg, Oral, Daily, Sloan Takagi A, MD, 20 mg at 04/12/18 4665 .  heparin injection 5,000 Units, 5,000 Units, Subcutaneous, Q8H, Joshual Terrio A, MD .  HYDROmorphone (DILAUDID) injection 0.5 mg, 0.5 mg, Intravenous, Q3H PRN, Judith Part, MD .  labetalol (NORMODYNE,TRANDATE) injection 10-40 mg, 10-40 mg, Intravenous, Q10 min PRN, Judith Part, MD .  lisinopril (PRINIVIL,ZESTRIL) tablet 10 mg, 10 mg, Oral, Daily, Judith Part, MD, 10 mg at 04/12/18 0917 .  ondansetron (ZOFRAN) tablet 4 mg, 4 mg, Oral, Q4H PRN **OR** ondansetron (ZOFRAN) injection 4 mg, 4 mg, Intravenous, Q4H PRN, Kellan Raffield A, MD .  oxyCODONE (Oxy IR/ROXICODONE) immediate release tablet 10 mg, 10 mg, Oral, Q4H PRN, Judith Part, MD, 10 mg at 04/12/18 0917 .  oxyCODONE (Oxy IR/ROXICODONE) immediate release tablet 5 mg, 5 mg, Oral, Q4H PRN, Otha Rickles A, MD .  pantoprazole (PROTONIX) EC tablet 40 mg, 40 mg, Oral, Daily, Judith Part, MD, 40 mg at 04/12/18 0917 .  polyethylene glycol (MIRALAX / GLYCOLAX) packet 17 g, 17 g, Oral, Daily PRN, Baran Kuhrt, Joyice Faster, MD .  promethazine (PHENERGAN) tablet 12.5-25 mg, 12.5-25 mg, Oral, Q4H PRN, Judith Part, MD .  zolpidem (AMBIEN) tablet 5 mg, 5 mg, Oral, QHS, Chane Cowden, Joyice Faster, MD, 5 mg at 04/11/18 2101   Physical Exam: Standing up with assistance, working with PT Incision - dressing d/c'd, incision c/d/i  Assessment & Plan: 63 y.o. man s/p fall off roof with 3 column injury at L4 s/p L3-5 posterior instrumented fusion, recovering well. -Activity as tolerated, f/u PT/OT recs -tolerating regular diet and voiding well without foley, if pain is controlled well enough after working with PT/OT, can be discharged home. Will  place orders for face to face -pt having tenderness over right greater trochanter but is still able to ambulate, trauma workup / XR pelvis negative for hip frx -SCDs/TEDs/SQH  Joyice Faster Lesia Monica  04/12/18 10:05 AM

## 2018-04-13 MED ORDER — CYCLOBENZAPRINE HCL 10 MG PO TABS
10.0000 mg | ORAL_TABLET | Freq: Three times a day (TID) | ORAL | Status: DC | PRN
  Administered 2018-04-13: 10 mg via ORAL
  Filled 2018-04-13: qty 1

## 2018-04-13 MED ORDER — BISACODYL 10 MG RE SUPP
10.0000 mg | Freq: Every day | RECTAL | Status: DC | PRN
  Administered 2018-04-13: 10 mg via RECTAL
  Filled 2018-04-13: qty 1

## 2018-04-13 NOTE — Progress Notes (Signed)
Occupational Therapy Treatment Patient Details Name: Brendan Sherman MRN: [id number] DOB: 1955/05/23 Today's Date: 04/13/2018    History of present illness 63 y.o. man that presents with severe back pain. He fell off a roof and went to an outside ED. Orthopedist advised the ED physician that it was an unstable fracture and that the patient should be transferred for surgical treatment. Pt left AMA, and 3 days later went to Orthopedic clinic via ambulance and was transferred to hospital for surgery. Pt s/p L3-L5 posterior lumbar instrumented fusion 04/10/18   OT comments  Pt. Seen for skilled OT session.  Focus of today's session was bed mobility in preparation for bsc transfer.  Pt. Remains limited by pain and was max a to transition into sitting and required 3 attempts to get into sitting.  Unable to tolerate sitting eob and could not proceed with transfer today.  At this current level of limited function, along with pt. And spouse citing she can not provide physical assistance needed, recommend discharge plan be updated to SNF for continued therapies.  Will alert OTRL to update discharge recommendations.   Follow Up Recommendations  Home health OT;Other (comment)    Equipment Recommendations  3 in 1 bedside commode    Recommendations for Other Services      Precautions / Restrictions Precautions Precautions: Fall;Back Precaution Comments: reviewed 3/3 precautions with wife present       Mobility Bed Mobility Overal bed mobility: Needs Assistance Bed Mobility: Rolling;Sidelying to Sit;Sit to Sidelying Rolling: Min guard Sidelying to sit: Max assist     Sit to sidelying: Mod assist General bed mobility comments: attempt x3 for sidelying to sit with hob flat, no rails for simulated home env. and to determine current level of assist as wife has had recent neck surgery and can not perform heavy lifting.  pt. unable x2 secondary to R hipthigh spasms.  insisted back to bed even with max a to  transition into sitting.  3rd attempt able to transition into sitting with heavy max a and increased time.  still unable to shift and bear weight into R hip while sitting.  attempted sit to stand as pt. states he wants to get onto bsc. refusal to follow inst. for hand placement, and sequencing to attempt a sit to stand.  cont. to increase in agitation and very mean verbal comments.  states "i just cant i need to Aym[place] down i have to".  assist for BLE back into bed, vc for sequencing for bed mobility maintaining precautions  Transfers                 General transfer comment: attempted sit to stand and also inst. on squat pivot. pt. refusal to attempt stating pain was too great.  would not recieve the instructions to complete the tasks.     Balance                                           ADL either performed or assessed with clinical judgement   ADL                                               Vision       Perception     Praxis  Cognition Arousal/Alertness: Awake/alert Behavior During Therapy: Agitated;Anxious                                   General Comments: very aggressive,sarcastic and impatient.  wife having to say multiple times "stop, that is not nice dont talk to her that way she is just trying to help"        Exercises     Shoulder Instructions       General Comments  pt. And spouse report she has had recent neck sx. And can not perform heavy pulling or lifting.     Pertinent Vitals/ Pain       Pain Assessment: 0-10 Pain Score: 7  Pain Location: back and R hip and thigh with any movement Pain Descriptors / Indicators: Burning;Shooting;Stabbing  Home Living                                          Prior Functioning/Environment              Frequency  Min 3X/week        Progress Toward Goals  OT Goals(current goals can now be found in the care plan section)   Progress towards OT goals: Not progressing toward goals - comment(pain)     Plan Discharge plan needs to be updated(pt. will need SNF)    Co-evaluation                 AM-PAC OT "6 Clicks" Daily Activity     Outcome Measure   Help from another person eating meals?: None Help from another person taking care of personal grooming?: A Little Help from another person toileting, which includes using toliet, bedpan, or urinal?: A Lot Help from another person bathing (including washing, rinsing, drying)?: A Lot Help from another person to put on and taking off regular upper body clothing?: A Lot Help from another person to put on and taking off regular lower body clothing?: A Lot 6 Click Score: 15    End of Session Equipment Utilized During Treatment: Gait belt  OT Visit Diagnosis: Unsteadiness on feet (R26.81);Other abnormalities of gait and mobility (R26.89);Pain Pain - Right/Left: Right Pain - part of body: Leg   Activity Tolerance Patient limited by pain   Patient Left in bed;with call bell/phone within reach;with family/visitor present   Nurse Communication          Time: 6203-5597 OT Time Calculation (min): 27 min  Charges: OT General Charges $OT Visit: 1 Visit OT Treatments $Self Care/Home Management : 23-37 mins   Janice Coffin, COTAL 04/13/2018, 8:51 AM

## 2018-04-13 NOTE — Plan of Care (Signed)
  Problem: Coping: Goal: Level of anxiety will decrease Outcome: Progressing   Problem: Pain Managment: Goal: General experience of comfort will improve Outcome: Progressing   Problem: Safety: Goal: Ability to remain free from injury will improve Outcome: Progressing   Problem: Skin Integrity: Goal: Risk for impaired skin integrity will decrease Outcome: Progressing   

## 2018-04-13 NOTE — Plan of Care (Signed)
  Problem: Education: Goal: Knowledge of General Education information will improve Description: Including pain rating scale, medication(s)/side effects and non-pharmacologic comfort measures Outcome: Progressing   Problem: Activity: Goal: Risk for activity intolerance will decrease Outcome: Progressing   

## 2018-04-13 NOTE — NC FL2 (Signed)
[place] LEVEL OF CARE SCREENING TOOL     IDENTIFICATION  Patient Name: Brendan Sherman Birthdate: Jul 26, 1955 Sex: male Admission Date (Current Location): 04/10/2018  [facility] and [place] Number:  Herbalist and Address:  [facility], [employer] [address] [place], [place] [address]      Provider Number: [id number]  Attending Physician Name and Address:  Judith Part, MD  Relative Name and Phone Number:  Kemuel Buchmann, [phone]    Current Level of Care: Hospital Recommended Level of Care: [place] Prior Approval Number:    Date Approved/Denied:   PASRR Number: [id number]  Discharge Plan: SNF    Current Diagnoses: Patient Active Problem List   Diagnosis Date Noted  . Unstable burst fracture of fourth lumbar vertebra, initial encounter for closed fracture ([place]) 04/10/2018    Orientation RESPIRATION BLADDER Height & Weight     Self, Time, Situation, Place  Normal Continent Weight: 189 lb (85.7 kg) Height:  5\' 9"  (175.3 cm)  BEHAVIORAL SYMPTOMS/MOOD NEUROLOGICAL BOWEL NUTRITION STATUS      Continent Diet(regular)  AMBULATORY STATUS COMMUNICATION OF NEEDS Skin   Extensive Assist Verbally (P) Surgical wounds                       Personal Care Assistance Level of Assistance  Bathing, Feeding, Dressing Bathing Assistance: Maximum assistance(pt mod assisst ) Feeding assistance: Independent Dressing Assistance: Maximum assistance(pt mod assist)     Functional Limitations Info  Sight, Hearing, Speech Sight Info: Adequate Hearing Info: Adequate Speech Info: Adequate    SPECIAL CARE FACTORS FREQUENCY  PT (By licensed PT), OT (By licensed OT)     PT Frequency: 5x wk OT Frequency: 5x wk            Contractures Contractures Info: Not present    Additional Factors Info  Code Status, Allergies Code Status Info: full code Allergies Info: NKA           Current Medications (04/13/2018):  This  is the current hospital active medication list Current Facility-Administered Medications  Medication Dose Route Frequency Provider Last Rate Last Dose  . acetaminophen (TYLENOL) tablet 650 mg  650 mg Oral Q4H PRN Judith Part, MD       Or  . acetaminophen (TYLENOL) suppository 650 mg  650 mg Rectal Q4H PRN Judith Part, MD      . bisacodyl (DULCOLAX) suppository 10 mg  10 mg Rectal Daily PRN Viona Gilmore D, NP      . cyclobenzaprine (FLEXERIL) tablet 10 mg  10 mg Oral TID PRN Viona Gilmore D, NP      . docusate sodium (COLACE) capsule 100 mg  100 mg Oral BID Judith Part, MD   100 mg at 04/13/18 1039  . escitalopram (LEXAPRO) tablet 20 mg  20 mg Oral Daily Judith Part, MD   20 mg at 04/13/18 1039  . heparin injection 5,000 Units  5,000 Units Subcutaneous Q8H Judith Part, MD   5,000 Units at 04/13/18 [phone]  . HYDROmorphone (DILAUDID) injection 0.5 mg  0.5 mg Intravenous Q3H PRN Judith Part, MD      . labetalol (NORMODYNE,TRANDATE) injection 10-40 mg  10-40 mg Intravenous Q10 min PRN Judith Part, MD      . lisinopril (PRINIVIL,ZESTRIL) tablet 10 mg  10 mg Oral Daily Judith Part, MD   10 mg at 04/13/18 1039  . ondansetron (ZOFRAN) tablet 4 mg  4 mg Oral Q4H PRN Judith Part, MD       Or  . ondansetron (ZOFRAN) injection 4 mg  4 mg Intravenous Q4H PRN Judith Part, MD      . oxyCODONE (Oxy IR/ROXICODONE) immediate release tablet 10 mg  10 mg Oral Q4H PRN Judith Part, MD   10 mg at 04/13/18 1039  . oxyCODONE (Oxy IR/ROXICODONE) immediate release tablet 5 mg  5 mg Oral Q4H PRN Judith Part, MD      . pantoprazole (PROTONIX) EC tablet 40 mg  40 mg Oral Daily Judith Part, MD   40 mg at 04/13/18 1039  . polyethylene glycol (MIRALAX / GLYCOLAX) packet 17 g  17 g Oral Daily PRN Judith Part, MD   17 g at 04/13/18 9373  . promethazine (PHENERGAN) tablet 12.5-25 mg  12.5-25 mg Oral Q4H PRN Judith Part, MD      . zolpidem (AMBIEN) tablet 5 mg  5 mg Oral QHS Judith Part, MD   5 mg at 04/12/18 2113     Discharge Medications: Please see discharge summary for a list of discharge medications.  Relevant Imaging Results:  Relevant Lab Results:   Additional Information SS# [id number]  Wende Neighbors, LCSW

## 2018-04-13 NOTE — Progress Notes (Signed)
Physical Therapy Treatment Patient Details Name: Brendan Sherman MRN: [id number] DOB: 11-29-1955 Today's Date: 04/13/2018    History of Present Illness 63 y.o. man that presents with severe back pain. He fell off a roof and went to an outside ED. Orthopedist advised the ED physician that it was an unstable fracture and that the patient should be transferred for surgical treatment. Pt left AMA, and 3 days later went to Orthopedic clinic via ambulance and was transferred to hospital for surgery. Pt s/p L3-L5 posterior lumbar instrumented fusion 04/10/18    PT Comments    Continuing work on functional mobility and activity tolerance;  Pain is limiting his progress overall, but he DID make modest, but notable progress today compared to last session; Stood to RW and walked approx 5 feet; Very slow moving and requiring gentle encouragement; Updating DC recs to sNF   Follow Up Recommendations  SNF     Equipment Recommendations  Rolling walker with 5" wheels;3in1 (PT)    Recommendations for Other Services       Precautions / Restrictions Precautions Precautions: Fall;Back Precaution Booklet Issued: Yes (comment) Precaution Comments: reviewed 3/3 precautions with wife present; Pt able to state 2/3, cues for no twisting    Mobility  Bed Mobility Overal bed mobility: Needs Assistance Bed Mobility: Rolling;Sidelying to Sit Rolling: Min guard Sidelying to sit: Mod assist;+2 for safety/equipment     Sit to sidelying: Mod assist General bed mobility comments: Slow, with heavy mod assist and second person behind to elevate trunk to sit; tending to posterior lean at initial sit  Transfers Overall transfer level: Needs assistance Equipment used: Rolling walker (2 wheeled) Transfers: Sit to/from Stand Sit to Stand: Mod assist;+2 physical assistance         General transfer comment: Mod assist t power up and steady; Heavy dependence on RW for support  Ambulation/Gait Ambulation/Gait  assistance: +2 safety/equipment;Mod assist Gait Distance (Feet): 5 Feet Assistive device: Rolling walker (2 wheeled) Gait Pattern/deviations: Step-to pattern;Decreased step length - right;Decreased stance time - left;Decreased weight shift to left;Antalgic;Trunk flexed Gait velocity: slowed   General Gait Details: Heavy dependence on Rw with Mod assist to advance RW; second person to push chair behind him   Stairs             Wheelchair Mobility    Modified Rankin (Stroke Patients Only)       Balance     Sitting balance-Leahy Scale: Poor   Postural control: Posterior lean(initially)   Standing balance-Leahy Scale: Poor                              Cognition Arousal/Alertness: Awake/alert Behavior During Therapy: Agitated;Anxious Overall Cognitive Status: Within Functional Limits for tasks assessed                                 General Comments: Internally distracted by pain, and tends to take out frustrations on staff      Exercises      General Comments General comments (skin integrity, edema, etc.): Wife present throughout session      Pertinent Vitals/Pain Pain Assessment: 0-10 Pain Score: 8  Pain Location: back and R hip and thigh with any movement Pain Descriptors / Indicators: Burning;Shooting;Stabbing Pain Intervention(s): Monitored during session;Premedicated before session;Repositioned    Home Living  Prior Function            PT Goals (current goals can now be found in the care plan section) Acute Rehab PT Goals Patient Stated Goal: go home PT Goal Formulation: With patient/family Time For Goal Achievement: 04/25/18 Potential to Achieve Goals: Fair Progress towards PT goals: Progressing toward goals(very slowly)    Frequency    Min 5X/week      PT Plan Discharge plan needs to be updated(Slow progress with mobiltiy)    Co-evaluation              AM-PAC PT "6  Clicks" Mobility   Outcome Measure  Help needed turning from your back to your side while in a flat bed without using bedrails?: A Lot Help needed moving from lying on your back to sitting on the side of a flat bed without using bedrails?: A Lot Help needed moving to and from a bed to a chair (including a wheelchair)?: A Lot Help needed standing up from a chair using your arms (e.g., wheelchair or bedside chair)?: A Lot Help needed to walk in hospital room?: A Little Help needed climbing 3-5 steps with a railing? : A Little 6 Click Score: 14    End of Session Equipment Utilized During Treatment: Gait belt Activity Tolerance: Patient limited by pain Patient left: in chair;with call bell/phone within reach;with family/visitor present Nurse Communication: Mobility status;Precautions PT Visit Diagnosis: Other abnormalities of gait and mobility (R26.89);Muscle weakness (generalized) (M62.81);Difficulty in walking, not elsewhere classified (R26.2);Other symptoms and signs involving the nervous system (R29.898);Pain Pain - Right/Left: Left Pain - part of body: Leg     Time: 1040-1101 PT Time Calculation (min) (ACUTE ONLY): 21 min  Charges:  $Therapeutic Activity: 8-22 mins                     Roney Marion, PT  Acute Rehabilitation Services Pager [phone] Office [place] 04/13/2018, 11:36 AM

## 2018-04-13 NOTE — Progress Notes (Signed)
   Providing Compassionate, Quality Care - Together   Subjective: Patient reports severe pain and constipation. He is having muscle spasms. Wife does not feel she will be able to provide the level of care her husband will need at home, as he has not done well with therapies yesterday and today.  Objective: Vital signs in last 24 hours: Temp:  [97.7 F (36.5 C)-98.4 F (36.9 C)] 97.7 F (36.5 C) (02/15 2108) Pulse Rate:  [79-80] 79 (02/15 2108) Resp:  [18] 18 (02/15 1535) BP: (107-118)/(72-78) 107/72 (02/15 2108) SpO2:  [93 %-95 %] 93 % (02/15 2108)  Intake/Output from previous day: No intake/output data recorded. Intake/Output this shift: No intake/output data recorded.  Alert and oriented x 4 PERRLA MAE, Strength 5/5 BUE, BLE Incision clean, dry, intact, closed with staples  Lab Results: Recent Labs    04/10/18 1502  WBC 8.8  HGB 16.8  HCT 51.3  PLT 170   BMET Recent Labs    04/10/18 1502  NA 140  K 3.8  CL 103  CO2 24  GLUCOSE 93  BUN 17  CREATININE 1.01  CALCIUM 9.1    Studies/Results: No results found.  Assessment/Plan: Patient with unstable burst fracture that was admitted for L3-4 PLIF on 04-10-2018. Patient is not ambulating as well yesterday and today. There is concern for patient and wife's ability to manage at home. PT and OT are leaning more towards SNF at discharge.   LOS: 3 days    -added cyclobenzaprine for muscle spasms -added dulcolax suppository for constipation -Will adjust discharge plan based on PT's recommendation today.   Viona Gilmore, DNP, AGNP-C Nurse Practitioner  [facility] [place] [address] [place], [place], [place] [address] P: [phone]    F: [phone]  04/13/2018, 10:37 AM

## 2018-04-14 NOTE — Progress Notes (Signed)
Neurosurgery Service Progress Note  Subjective: No acute events overnight, still having soreness along the right hip but no new complaints  Objective: Vitals:   04/12/18 0346 04/12/18 1535 04/12/18 2108 04/13/18 1356  BP: 106/71 118/78 107/72 101/69  Pulse: 83 80 79 78  Resp:  18    Temp: 98.7 F (37.1 C) 98.4 F (36.9 C) 97.7 F (36.5 C) 99.2 F (37.3 C)  TempSrc:  Oral Oral Oral  SpO2: 96% 95% 93% 92%  Weight:      Height:       Temp (24hrs), Avg:99.2 F (37.3 C), Min:99.2 F (37.3 C), Max:99.2 F (37.3 C)  CBC Latest Ref Rng & Units 04/10/2018  WBC 4.0 - 10.5 K/uL 8.8  Hemoglobin 13.0 - 17.0 g/dL 16.8  Hematocrit 39.0 - 52.0 % 51.3  Platelets 150 - 400 K/uL 170   BMP Latest Ref Rng & Units 04/10/2018  Glucose 70 - 99 mg/dL 93  BUN 8 - 23 mg/dL 17  Creatinine 0.61 - 1.24 mg/dL 1.01  Sodium 135 - 145 mmol/L 140  Potassium 3.5 - 5.1 mmol/L 3.8  Chloride 98 - 111 mmol/L 103  CO2 22 - 32 mmol/L 24  Calcium 8.9 - 10.3 mg/dL 9.1    Intake/Output Summary (Last 24 hours) at 04/14/2018 8250 Last data filed at 04/13/2018 1700 Gross per 24 hour  Intake 1080 ml  Output 200 ml  Net 880 ml    Current Facility-Administered Medications:  .  acetaminophen (TYLENOL) tablet 650 mg, 650 mg, Oral, Q4H PRN **OR** acetaminophen (TYLENOL) suppository 650 mg, 650 mg, Rectal, Q4H PRN, Judith Part, MD .  bisacodyl (DULCOLAX) suppository 10 mg, 10 mg, Rectal, Daily PRN, Reinaldo Meeker, Meghan D, NP, 10 mg at 04/13/18 1754 .  cyclobenzaprine (FLEXERIL) tablet 10 mg, 10 mg, Oral, TID PRN, Viona Gilmore D, NP, 10 mg at 04/13/18 1434 .  docusate sodium (COLACE) capsule 100 mg, 100 mg, Oral, BID, Judith Part, MD, 100 mg at 04/13/18 2108 .  escitalopram (LEXAPRO) tablet 20 mg, 20 mg, Oral, Daily, Garin Mata A, MD, 20 mg at 04/13/18 1039 .  heparin injection 5,000 Units, 5,000 Units, Subcutaneous, Q8H, Judith Part, MD, 5,000 Units at 04/14/18 [phone] .  HYDROmorphone  (DILAUDID) injection 0.5 mg, 0.5 mg, Intravenous, Q3H PRN, Judith Part, MD .  labetalol (NORMODYNE,TRANDATE) injection 10-40 mg, 10-40 mg, Intravenous, Q10 min PRN, Urijah Raynor A, MD .  lisinopril (PRINIVIL,ZESTRIL) tablet 10 mg, 10 mg, Oral, Daily, Dewon Mendizabal A, MD, 10 mg at 04/13/18 1039 .  ondansetron (ZOFRAN) tablet 4 mg, 4 mg, Oral, Q4H PRN **OR** ondansetron (ZOFRAN) injection 4 mg, 4 mg, Intravenous, Q4H PRN, Micha Dosanjh A, MD .  oxyCODONE (Oxy IR/ROXICODONE) immediate release tablet 10 mg, 10 mg, Oral, Q4H PRN, Judith Part, MD, 10 mg at 04/13/18 2108 .  oxyCODONE (Oxy IR/ROXICODONE) immediate release tablet 5 mg, 5 mg, Oral, Q4H PRN, Sincere Berlanga A, MD .  pantoprazole (PROTONIX) EC tablet 40 mg, 40 mg, Oral, Daily, Stetson Pelaez, Joyice Faster, MD, 40 mg at 04/13/18 1039 .  polyethylene glycol (MIRALAX / GLYCOLAX) packet 17 g, 17 g, Oral, Daily PRN, Judith Part, MD, 17 g at 04/13/18 6734 .  promethazine (PHENERGAN) tablet 12.5-25 mg, 12.5-25 mg, Oral, Q4H PRN, Judith Part, MD .  zolpidem (AMBIEN) tablet 5 mg, 5 mg, Oral, QHS, Cynthia Stainback, Joyice Faster, MD, 5 mg at 04/13/18 2108   Physical Exam: Standing up with assistance, working with PT Incision - dressing d/c'd,  incision c/d/i  Assessment & Plan: 64 y.o. man s/p fall off roof with 3 column injury at L4 s/p L3-5 posterior instrumented fusion, recovering well.  -f/u PT/OT rec SNF, transfer to SNF when logistically possible -SCDs/TEDs/SQH  Judith Part  04/14/18 7:21 AM

## 2018-04-14 NOTE — Clinical Social Work Note (Signed)
Clinical Social Work Assessment  Patient Details  Name: Brendan Sherman MRN: [id number] Date of Birth: 15-Nov-1955  Date of referral:  04/14/18               Reason for consult:  Discharge Planning                Permission sought to share information with:  Case Manager Permission granted to share information::  Yes, Verbal Permission Granted  Name::     Vaughan Basta  Agency::  [employer] [facility]  Relationship::  Wife  Contact Information:  [phone]  Housing/Transportation Living arrangements for the past 2 months:  [place] of Information:  Patient Patient Interpreter Needed:  None Criminal Activity/Legal Involvement Pertinent to Current Situation/Hospitalization:  No - Comment as needed Significant Relationships:  Spouse Lives with:  Spouse, Self Do you feel safe going back to the place where you live?  Yes Need for family participation in patient care:  No (Coment)  Care giving concerns:    CSW received consult for SNF placement for the patient. Patient is recovering from having back surgery and is needing more care than his wife can provide. Patient and wife are understanding of current PT recommendation.     Social Worker assessment / plan:    CSW met with wife and patient at bedside. Patient was alert and oriented. Patient is recovering from having back surgery. CSW met with the family and explained that the patient is being recommended for a short term rehab placement. Patient's wife had grandparents in a skilled nursing facility but was not clear on the process. CSW explained the SNF process to the patient and his wife. CSW provided the patient and his wife with the available bed offers. Patients wife had a concern that the [facility] had accepted the patient and she had lost family members in the past at that facility. She also expressed concern that it would be hard to get back and forth the [place] every day to help care for her husband. Patient and  patient's wife expressed that there were interested in other SNF placements in [facility]. They stated that they were interested in [facility] and [facility]. CSW also discussed with the other facilities in [place] and in [place]. CSW stated that she would follow up about facility preferences to see if they could get an offer. CSW expressed that if the patient does not get an offer from preference facility then the family would need to make a decision about whether or not to pursue with a facility that has made a bed offer.   CSW has called and spoken with Mardene Celeste at [facility] She stated that she would look at the patient and make a decision. CSW also called Kerri at the [facility] and left a message.    Employment status:  Retired Nurse, adult PT Recommendations:  [place] / Referral to community resources:  [place]  Patient/Family's Response to care:  Patient and wife are aware of the patients need to have additional rehab to help recover from his back surgery.   Patient/Family's Understanding of and Emotional Response to Diagnosis, Current Treatment, and Prognosis:  Patient is understanding of current needs and goals. Patient's wife is supportive and willing to help her husband to the best of her ability.   Emotional Assessment Appearance:  Appears stated age Attitude/Demeanor/Rapport:  Unable to Assess Affect (typically observed):  Unable to Assess Orientation:  Oriented to Self, Oriented to Place, Oriented to Situation Alcohol / Substance use:  Not Applicable Psych involvement (Current and /or in the community):  No (Comment)  Discharge Needs  Concerns to be addressed:  Discharge Planning Concerns Readmission within the last 30 days:  No Current discharge risk:  Dependent with Mobility Barriers to Discharge:  [place], LCSWA 04/14/2018, 10:58 AM

## 2018-04-14 NOTE — Progress Notes (Signed)
Gave bed offers to patient and his wife. They were hesitant about current offers. They prefer either [facility] or the [facility].   CSW called both facilities. CSW spoke with Mardene Celeste at [facility], they will look at the patients information and make a decision. CSW left a Advertising account executive for [employer], Engineer, site at [facility].   CSW is awaiting bed offers from patients preferences. CSW will continue to follow.   Domenic Schwab, MSW, [place]

## 2018-04-14 NOTE — Progress Notes (Signed)
Spoke with patients wife again, they are also [facility]. CSW faxed out. [place] has denied.   CSW will continue to follow and provide bed offers. Will need to start insurance auth.   Domenic Schwab, MSW, [place]

## 2018-04-14 NOTE — Progress Notes (Signed)
Physical Therapy Treatment Patient Details Name: Brendan Sherman MRN: [id number] DOB: 1955-06-14 Today's Date: 04/14/2018    History of Present Illness 63 y.o. man that presents with severe back pain. He fell off a roof and went to an outside ED. Orthopedist advised the ED physician that it was an unstable fracture and that the patient should be transferred for surgical treatment. Pt left AMA, and 3 days later went to Orthopedic clinic via ambulance and was transferred to hospital for surgery. Pt s/p L3-L5 posterior lumbar instrumented fusion 04/10/18    PT Comments    Continuing work on functional mobility and activity tolerance;  Notable improvement in gait distance and activity tolerance; Able to walk outside his hospital room today, with less assist needed to advance RW; Recommend he walk with assist to the bathroom rather than use the [place]; I'm hopeful that he can keep this momentum of progress, and will continue to progress functional mobility at post-acute rehabilitation; We discussed practicing more serial sit<>stands next session   Follow Up Recommendations  SNF     Equipment Recommendations  Rolling walker with 5" wheels;3in1 (PT)    Recommendations for Other Services       Precautions / Restrictions Precautions Precautions: Fall;Back Precaution Booklet Issued: Yes (comment) Precaution Comments: Reviewed back precautions    Mobility  Bed Mobility                  Transfers Overall transfer level: Needs assistance Equipment used: Rolling walker (2 wheeled) Transfers: Sit to/from Stand Sit to Stand: Mod assist         General transfer comment: Mod assist t power up and steady; Heavy dependence on RW for support; cues for hand placement, and to push off of armrests of chair to initiate sit to stand; light mod assist to help with descent to sit  Ambulation/Gait Ambulation/Gait assistance: Min assist Gait Distance (Feet): (Hallway ambulation) Assistive device:  Rolling walker (2 wheeled) Gait Pattern/deviations: Step-through pattern;Decreased step length - right;Decreased step length - left     General Gait Details: Much improved with less need for assist to advance RW; cues for upright posture and to relax shoulders; less need for chair push today   Stairs             Wheelchair Mobility    Modified Rankin (Stroke Patients Only)       Balance                                            Cognition Arousal/Alertness: Awake/alert Behavior During Therapy: WFL for tasks assessed/performed;Anxious Overall Cognitive Status: Within Functional Limits for tasks assessed                                 General Comments: Internally distracted by pain, and tends to take out frustrations on staff      Exercises Other Exercises Other Exercises: Serial sit<>stand x4 for LE strengthening, and to reinforce movement patterns for sit to stand    General Comments        Pertinent Vitals/Pain Pain Assessment: Faces Faces Pain Scale: Hurts even more Pain Location: back and R hip and thigh with any movement Pain Descriptors / Indicators: Burning;Shooting;Stabbing Pain Intervention(s): Monitored during session;Premedicated before session    Home Living  Prior Function            PT Goals (current goals can now be found in the care plan section) Acute Rehab PT Goals Patient Stated Goal: go home PT Goal Formulation: With patient/family Time For Goal Achievement: 04/25/18 Potential to Achieve Goals: Fair Progress towards PT goals: Progressing toward goals    Frequency    Min 5X/week      PT Plan Current plan remains appropriate    Co-evaluation              AM-PAC PT "6 Clicks" Mobility   Outcome Measure  Help needed turning from your back to your side while in a flat bed without using bedrails?: A Lot Help needed moving from lying on your back to sitting  on the side of a flat bed without using bedrails?: A Lot Help needed moving to and from a bed to a chair (including a wheelchair)?: A Lot Help needed standing up from a chair using your arms (e.g., wheelchair or bedside chair)?: A Lot Help needed to walk in hospital room?: A Little Help needed climbing 3-5 steps with a railing? : A Little 6 Click Score: 14    End of Session Equipment Utilized During Treatment: Gait belt Activity Tolerance: Patient tolerated treatment well Patient left: in chair;with call bell/phone within reach;with family/visitor present Nurse Communication: Mobility status;Precautions PT Visit Diagnosis: Other abnormalities of gait and mobility (R26.89);Muscle weakness (generalized) (M62.81);Difficulty in walking, not elsewhere classified (R26.2);Other symptoms and signs involving the nervous system (R29.898);Pain Pain - Right/Left: Left Pain - part of body: Leg     Time: 1352(start time is approximate)-1417 PT Time Calculation (min) (ACUTE ONLY): 25 min  Charges:  $Gait Training: 8-22 mins $Therapeutic Activity: 8-22 mins                     Roney Marion, PT  Acute Rehabilitation Services Pager [phone] Office [place] 04/14/2018, 4:26 PM

## 2018-04-14 NOTE — Progress Notes (Signed)
Occupational therapy Note  Pt progress is not as expected and he therefore, is unsafe for discharge home at this time as he requires max A for bed mobility and max A for ADLs.  Wife is unable to provide this level of assist.  Discharge recommendations changed to SNF.  Lucille Passy, OTR/L Runner, broadcasting/film/video [phone] Office [phone]

## 2018-04-15 NOTE — Progress Notes (Signed)
Occupational Therapy Treatment Patient Details Name: Brendan Sherman MRN: [id number] DOB: 02/09/1956 Today's Date: 04/15/2018    History of present illness 63 y.o. man that presents with severe back pain. He fell off a roof and went to an outside ED. Orthopedist advised the ED physician that it was an unstable fracture and that the patient should be transferred for surgical treatment. Pt left AMA, and 3 days later went to Orthopedic clinic via ambulance and was transferred to hospital for surgery. Pt s/p L3-L5 posterior lumbar instrumented fusion 04/10/18   OT comments  This 63 yo male had just gotten back to bed when I entered room. Stating he had been up for 2 hours, his pain meds were due in about 15 minutes and PT was coming in 30 minutes. He was agreeable to me demonstrating how AE worked and having his wife see how to use the 3n1 as a shower seat in tub. Will continue to follow.   Follow Up Recommendations  SNF;Supervision/Assistance - 24 hour    Equipment Recommendations  3 in 1 bedside commode       Precautions / Restrictions Precautions Precautions: Fall;Back Precaution Booklet Issued: Yes (comment) Precaution Comments: Reviewed back precautions Restrictions Weight Bearing Restrictions: No              ADL either performed or assessed with clinical judgement   ADL Overall ADL's : Needs assistance/impaired                                       General ADL Comments: Educated pt and wife on AE kit and how to use reacher to doff socks, doff and donn underwear and pants (simulated with pillow case), don socks with sock aid, as well as going over the use of all the other pieces in the kit (long sponge, long shoe horn, dressing stick, and elastic laces).  Took pt's wife to gym where tub is to show her how pt could use 3n1 as a shower seat (how to adjust it, how to side step into tub, how to manipulate shower curtain to keep water from getting out onto floor). Educated  them on pt not sitting for more than 30-45 minutes at a time that way he won't be as stiff and in pain when he gets up.     Vision Patient Visual Report: No change from baseline                             Pertinent Vitals/ Pain       Pain Assessment: 0-10 Faces Pain Scale: Hurts little more Pain Location: back  (at rest, had sat up in recliner for 2 hours per his report) Pain Descriptors / Indicators: Aching;Sore Pain Intervention(s): (pain meds due in about 15 minutes per pt)         Frequency  Min 3X/week        Progress Toward Goals  OT Goals(current goals can now be found in the care plan section)  Progress towards OT goals: Progressing toward goals     Plan Discharge plan needs to be updated       AM-PAC OT "6 Clicks" Daily Activity     Outcome Measure   Help from another person eating meals?: None Help from another person taking care of personal grooming?: A Little Help from another person toileting, which includes using  toliet, bedpan, or urinal?: A Lot Help from another person bathing (including washing, rinsing, drying)?: A Lot Help from another person to put on and taking off regular upper body clothing?: A Lot Help from another person to put on and taking off regular lower body clothing?: A Lot 6 Click Score: 15    End of Session    OT Visit Diagnosis: Other abnormalities of gait and mobility (R26.89);Pain Pain - part of body: (back)   Activity Tolerance     Patient Left in bed;with call bell/phone within reach   Nurse Communication          Time: 1233-1300 OT Time Calculation (min): 27 min  Charges: OT General Charges $OT Visit: 1 Visit OT Treatments $Self Care/Home Management : 23-37 mins  [place], OTR/L Acute [employer] Pager [phone] Office [phone]      Almon Register 04/15/2018, 2:57 PM

## 2018-04-15 NOTE — Progress Notes (Signed)
Neurosurgery Service Progress Note  Subjective: No acute events overnight, pain and ambulation improving  Objective: Vitals:   04/15/18 0454 04/15/18 0754 04/15/18 0755 04/15/18 1400  BP: 104/77 113/73 113/73 104/75  Pulse: 70 89 74 92  Resp: 16 16  16   Temp: 97.9 F (36.6 C) 97.7 F (36.5 C) 97.7 F (36.5 C) (!) 97.4 F (36.3 C)  TempSrc: Oral Oral Oral   SpO2: 97% 98% 97% 96%  Weight:      Height:       Temp (24hrs), Avg:97.7 F (36.5 C), Min:97.4 F (36.3 C), Max:97.9 F (36.6 C)  CBC Latest Ref Rng & Units 04/10/2018  WBC 4.0 - 10.5 K/uL 8.8  Hemoglobin 13.0 - 17.0 g/dL 16.8  Hematocrit 39.0 - 52.0 % 51.3  Platelets 150 - 400 K/uL 170   BMP Latest Ref Rng & Units 04/10/2018  Glucose 70 - 99 mg/dL 93  BUN 8 - 23 mg/dL 17  Creatinine 0.61 - 1.24 mg/dL 1.01  Sodium 135 - 145 mmol/L 140  Potassium 3.5 - 5.1 mmol/L 3.8  Chloride 98 - 111 mmol/L 103  CO2 22 - 32 mmol/L 24  Calcium 8.9 - 10.3 mg/dL 9.1    Intake/Output Summary (Last 24 hours) at 04/15/2018 2054 Last data filed at 04/15/2018 1700 Gross per 24 hour  Intake 840 ml  Output 1250 ml  Net -410 ml    Current Facility-Administered Medications:  .  acetaminophen (TYLENOL) tablet 650 mg, 650 mg, Oral, Q4H PRN **OR** acetaminophen (TYLENOL) suppository 650 mg, 650 mg, Rectal, Q4H PRN, Judith Part, MD .  bisacodyl (DULCOLAX) suppository 10 mg, 10 mg, Rectal, Daily PRN, Reinaldo Meeker, Meghan D, NP, 10 mg at 04/13/18 1754 .  cyclobenzaprine (FLEXERIL) tablet 10 mg, 10 mg, Oral, TID PRN, Viona Gilmore D, NP, 10 mg at 04/13/18 1434 .  docusate sodium (COLACE) capsule 100 mg, 100 mg, Oral, BID, Judith Part, MD, 100 mg at 04/14/18 2217 .  escitalopram (LEXAPRO) tablet 20 mg, 20 mg, Oral, Daily, Chariti Havel A, MD, 20 mg at 04/15/18 1103 .  heparin injection 5,000 Units, 5,000 Units, Subcutaneous, Q8H, Judith Part, MD, 5,000 Units at 04/15/18 1444 .  HYDROmorphone (DILAUDID) injection 0.5 mg,  0.5 mg, Intravenous, Q3H PRN, Judith Part, MD .  labetalol (NORMODYNE,TRANDATE) injection 10-40 mg, 10-40 mg, Intravenous, Q10 min PRN, Tina Gruner A, MD .  lisinopril (PRINIVIL,ZESTRIL) tablet 10 mg, 10 mg, Oral, Daily, Judith Part, MD, 10 mg at 04/14/18 0923 .  ondansetron (ZOFRAN) tablet 4 mg, 4 mg, Oral, Q4H PRN **OR** ondansetron (ZOFRAN) injection 4 mg, 4 mg, Intravenous, Q4H PRN, Kaleiyah Polsky A, MD .  oxyCODONE (Oxy IR/ROXICODONE) immediate release tablet 10 mg, 10 mg, Oral, Q4H PRN, Judith Part, MD, 10 mg at 04/15/18 1737 .  oxyCODONE (Oxy IR/ROXICODONE) immediate release tablet 5 mg, 5 mg, Oral, Q4H PRN, Judith Part, MD, 5 mg at 04/14/18 0927 .  pantoprazole (PROTONIX) EC tablet 40 mg, 40 mg, Oral, Daily, Kileigh Ortmann, Joyice Faster, MD, 40 mg at 04/15/18 1102 .  polyethylene glycol (MIRALAX / GLYCOLAX) packet 17 g, 17 g, Oral, Daily PRN, Judith Part, MD, 17 g at 04/13/18 1941 .  promethazine (PHENERGAN) tablet 12.5-25 mg, 12.5-25 mg, Oral, Q4H PRN, Judith Part, MD .  zolpidem (AMBIEN) tablet 5 mg, 5 mg, Oral, QHS, Jarry Manon, Joyice Faster, MD, 5 mg at 04/14/18 2217   Physical Exam: AOx3, PERRL, gaze neutral, FS, TM, Strength 5/5x4, SILTx4 Incision - dressing  d/c'd, incision c/d/i  Assessment & Plan: 63 y.o. man s/p fall off roof with 3 column injury at L4 s/p L3-5 posterior instrumented fusion, recovering well.  -f/u PT/OT rec SNF, transfer to SNF when logistically possible -SCDs/TEDs/SQH  Judith Part  04/15/18 8:54 PM

## 2018-04-15 NOTE — Progress Notes (Signed)
Physical Therapy Treatment Patient Details Name: Brendan Sherman MRN: [id number] DOB: Jun 08, 1955 Today's Date: 04/15/2018    History of Present Illness 63 y.o. man that presents with severe back pain. He fell off a roof and went to an outside ED. Orthopedist advised the ED physician that it was an unstable fracture and that the patient should be transferred for surgical treatment. Pt left AMA, and 3 days later went to Orthopedic clinic via ambulance and was transferred to hospital for surgery. Pt s/p L3-L5 posterior lumbar instrumented fusion 04/10/18    PT Comments    Continuing work on functional mobility and activity tolerance;  Notable improvements in sit <>stand, especially with more practice; noted dependence on momentum for rise; cues for smooth transition and to push through LEs; Overall progressing well; Anticipate continuing good progress at post-acute rehabilitation.   Follow Up Recommendations  SNF     Equipment Recommendations  Rolling walker with 5" wheels;3in1 (PT)    Recommendations for Other Services       Precautions / Restrictions Precautions Precautions: Fall;Back Precaution Booklet Issued: Yes (comment) Precaution Comments: Reviewed back precautions Restrictions Weight Bearing Restrictions: No    Mobility  Bed Mobility Overal bed mobility: Needs Assistance Bed Mobility: Rolling;Sidelying to Sit Rolling: Min guard Sidelying to sit: Min assist       General bed mobility comments: Slow, with min assist to elevate trunk to sit; tending to posterior lean at initial sit  Transfers Overall transfer level: Needs assistance Equipment used: Rolling walker (2 wheeled) Transfers: Sit to/from Stand Sit to Stand: Mod assist;Min assist         General transfer comment: Mod assist t power up and steady; Heavy dependence on RW for support; cues for hand placement, and to push off of armrests of chair to initiate sit to stand; light mod assist to help with descent to sit;  performed serial sit<>stands, with notable improvements with each rep  Ambulation/Gait Ambulation/Gait assistance: Min assist;Min guard Gait Distance (Feet): (Hallway ambulation) Assistive device: Rolling walker (2 wheeled) Gait Pattern/deviations: Step-through pattern;Decreased step length - right;Decreased step length - left Gait velocity: slowed   General Gait Details: Much improved with less need for assist to advance RW; cues for upright posture and to relax shoulders;  occasional grimacing during walk   Stairs             Wheelchair Mobility    Modified Rankin (Stroke Patients Only)       Balance     Sitting balance-Leahy Scale: Poor       Standing balance-Leahy Scale: Poor                              Cognition Arousal/Alertness: Awake/alert Behavior During Therapy: WFL for tasks assessed/performed;Anxious Overall Cognitive Status: Within Functional Limits for tasks assessed                                        Exercises Other Exercises Other Exercises: Serial sit<>stand x4 for LE strengthening, and to reinforce movement patterns for sit to stand x 5 reps    General Comments        Pertinent Vitals/Pain Pain Assessment: Faces Faces Pain Scale: Hurts whole lot Pain Location: back pain, periodically grimacing during walk Pain Descriptors / Indicators: Aching;Grimacing;Sore Pain Intervention(s): Monitored during session    Home Living  Prior Function            PT Goals (current goals can now be found in the care plan section) Acute Rehab PT Goals Patient Stated Goal: go home PT Goal Formulation: With patient/family Time For Goal Achievement: 04/25/18 Potential to Achieve Goals: Fair Progress towards PT goals: Progressing toward goals    Frequency    Min 5X/week      PT Plan Current plan remains appropriate    Co-evaluation              AM-PAC PT "6 Clicks"  Mobility   Outcome Measure  Help needed turning from your back to your side while in a flat bed without using bedrails?: A Lot Help needed moving from lying on your back to sitting on the side of a flat bed without using bedrails?: A Lot Help needed moving to and from a bed to a chair (including a wheelchair)?: A Lot Help needed standing up from a chair using your arms (e.g., wheelchair or bedside chair)?: A Little Help needed to walk in hospital room?: A Little Help needed climbing 3-5 steps with a railing? : A Little 6 Click Score: 15    End of Session Equipment Utilized During Treatment: Gait belt(near axillae) Activity Tolerance: Patient tolerated treatment well Patient left: in chair;with call bell/phone within reach;with family/visitor present Nurse Communication: Mobility status;Precautions PT Visit Diagnosis: Other abnormalities of gait and mobility (R26.89);Muscle weakness (generalized) (M62.81);Difficulty in walking, not elsewhere classified (R26.2);Other symptoms and signs involving the nervous system (R29.898);Pain Pain - Right/Left: Left Pain - part of body: Leg     Time: 1346-1415 PT Time Calculation (min) (ACUTE ONLY): 29 min  Charges:  $Gait Training: 8-22 mins $Therapeutic Activity: 8-22 mins                     Roney Marion, PT  Acute Rehabilitation Services Pager [phone] Office [place] 04/15/2018, 3:34 PM

## 2018-04-16 MED ORDER — CYCLOBENZAPRINE HCL 10 MG PO TABS: 10.0000 mg | ORAL_TABLET | Freq: Three times a day (TID) | ORAL | 0 refills | Status: DC | PRN

## 2018-04-16 NOTE — Progress Notes (Signed)
Pt discharged to facility via [place].

## 2018-04-16 NOTE — Progress Notes (Signed)
Neurosurgery Service Progress Note  Subjective: No acute events overnight, no new complaints this morning  Objective: Vitals:   04/15/18 1400 04/15/18 2352 04/16/18 0514 04/16/18 0753  BP: 104/75 111/75 103/66 110/74  Pulse: 92 75 67 90  Resp: 16 18 18 16   Temp: (!) 97.4 F (36.3 C) 97.7 F (36.5 C) 98.3 F (36.8 C) 98.1 F (36.7 C)  TempSrc:  Oral Oral Oral  SpO2: 96% 95% 95% 97%  Weight:      Height:       Temp (24hrs), Avg:97.9 F (36.6 C), Min:97.4 F (36.3 C), Max:98.3 F (36.8 C)  CBC Latest Ref Rng & Units 04/10/2018  WBC 4.0 - 10.5 K/uL 8.8  Hemoglobin 13.0 - 17.0 g/dL 16.8  Hematocrit 39.0 - 52.0 % 51.3  Platelets 150 - 400 K/uL 170   BMP Latest Ref Rng & Units 04/10/2018  Glucose 70 - 99 mg/dL 93  BUN 8 - 23 mg/dL 17  Creatinine 0.61 - 1.24 mg/dL 1.01  Sodium 135 - 145 mmol/L 140  Potassium 3.5 - 5.1 mmol/L 3.8  Chloride 98 - 111 mmol/L 103  CO2 22 - 32 mmol/L 24  Calcium 8.9 - 10.3 mg/dL 9.1    Intake/Output Summary (Last 24 hours) at 04/16/2018 3546 Last data filed at 04/16/2018 0915 Gross per 24 hour  Intake 960 ml  Output 1150 ml  Net -190 ml    Current Facility-Administered Medications:  .  acetaminophen (TYLENOL) tablet 650 mg, 650 mg, Oral, Q4H PRN **OR** acetaminophen (TYLENOL) suppository 650 mg, 650 mg, Rectal, Q4H PRN, Judith Part, MD .  bisacodyl (DULCOLAX) suppository 10 mg, 10 mg, Rectal, Daily PRN, Reinaldo Meeker, Meghan D, NP, 10 mg at 04/13/18 1754 .  cyclobenzaprine (FLEXERIL) tablet 10 mg, 10 mg, Oral, TID PRN, Viona Gilmore D, NP, 10 mg at 04/13/18 1434 .  docusate sodium (COLACE) capsule 100 mg, 100 mg, Oral, BID, Judith Part, MD, 100 mg at 04/16/18 0818 .  escitalopram (LEXAPRO) tablet 20 mg, 20 mg, Oral, Daily, Maggie Dworkin A, MD, 20 mg at 04/16/18 0818 .  heparin injection 5,000 Units, 5,000 Units, Subcutaneous, Q8H, Judith Part, MD, 5,000 Units at 04/16/18 0602 .  HYDROmorphone (DILAUDID) injection 0.5  mg, 0.5 mg, Intravenous, Q3H PRN, Judith Part, MD .  labetalol (NORMODYNE,TRANDATE) injection 10-40 mg, 10-40 mg, Intravenous, Q10 min PRN, Nicolette Gieske A, MD .  lisinopril (PRINIVIL,ZESTRIL) tablet 10 mg, 10 mg, Oral, Daily, Ammara Raj, Joyice Faster, MD, 10 mg at 04/16/18 0818 .  ondansetron (ZOFRAN) tablet 4 mg, 4 mg, Oral, Q4H PRN **OR** ondansetron (ZOFRAN) injection 4 mg, 4 mg, Intravenous, Q4H PRN, Riki Gehring A, MD .  oxyCODONE (Oxy IR/ROXICODONE) immediate release tablet 10 mg, 10 mg, Oral, Q4H PRN, Judith Part, MD, 10 mg at 04/16/18 0204 .  oxyCODONE (Oxy IR/ROXICODONE) immediate release tablet 5 mg, 5 mg, Oral, Q4H PRN, Judith Part, MD, 5 mg at 04/14/18 0927 .  pantoprazole (PROTONIX) EC tablet 40 mg, 40 mg, Oral, Daily, Judith Part, MD, 40 mg at 04/16/18 0818 .  polyethylene glycol (MIRALAX / GLYCOLAX) packet 17 g, 17 g, Oral, Daily PRN, Judith Part, MD, 17 g at 04/13/18 5681 .  promethazine (PHENERGAN) tablet 12.5-25 mg, 12.5-25 mg, Oral, Q4H PRN, Judith Part, MD .  zolpidem (AMBIEN) tablet 5 mg, 5 mg, Oral, QHS, Praneeth Bussey, Joyice Faster, MD, 5 mg at 04/15/18 2215   Physical Exam: AOx3, PERRL, gaze neutral, FS, TM, Strength 5/5x4, SILTx4 incision c/d/i  Assessment & Plan: 63 y.o. man s/p fall off roof with 3 column injury at L4 s/p L3-5 posterior instrumented fusion, recovering well.  -f/u PT/OT rec SNF, transfer to SNF when logistically possible -SCDs/TEDs/SQH  Judith Part  04/16/18 9:22 AM

## 2018-04-16 NOTE — Progress Notes (Signed)
Physical Therapy Treatment Patient Details Name: Brendan Sherman MRN: [id number] DOB: September 07, 1955 Today's Date: 04/16/2018    History of Present Illness 63 y.o. man that presents with severe back pain. He fell off a roof and went to an outside ED. Orthopedist advised the ED physician that it was an unstable fracture and that the patient should be transferred for surgical treatment. Pt left AMA, and 3 days later went to Orthopedic clinic via ambulance and was transferred to hospital for surgery. Pt s/p L3-L5 posterior lumbar instrumented fusion 04/10/18    PT Comments    Continuing work on functional mobility and activity tolerance;  Session focused on progressive amb (with notable improvements) and observing precautions with bed moblity; Performed sidelying<>sit 4 reps to reinforce correct movement patterns; Overall progressing well; Anticipate continuing good progress at post-acute rehabilitation.    Follow Up Recommendations  SNF     Equipment Recommendations  Rolling walker with 5" wheels;3in1 (PT)    Recommendations for Other Services       Precautions / Restrictions Precautions Precautions: Fall;Back Precaution Booklet Issued: Yes (comment) Precaution Comments: Reviewed back precautions Restrictions Weight Bearing Restrictions: No    Mobility  Bed Mobility Overal bed mobility: Needs Assistance Bed Mobility: Rolling;Sidelying to Sit;Sit to Sidelying Rolling: Min guard Sidelying to sit: Min guard     Sit to sidelying: Min guard General bed mobility comments: Slow moving, and close guard to maintain back prec, but no physical assist provided  Transfers Overall transfer level: Needs assistance Equipment used: Rolling walker (2 wheeled) Transfers: Sit to/from Stand Sit to Stand: Min assist;Min guard         General transfer comment: Cues for hand placement and to push through LEs; less dependence on momentum to get to stand  Ambulation/Gait Ambulation/Gait assistance: Min  assist;Min guard Gait Distance (Feet): ([place] ambulation) Assistive device: Rolling walker (2 wheeled) Gait Pattern/deviations: Step-through pattern;Decreased step length - right;Decreased step length - left Gait velocity: slowed   General Gait Details: Cues for upright posture, and to self-monitor for activity tolerance   Stairs             Wheelchair Mobility    Modified Rankin (Stroke Patients Only)       Balance     Sitting balance-Leahy Scale: Fair       Standing balance-Leahy Scale: Poor(approaching Fair)                              Cognition Arousal/Alertness: Awake/alert Behavior During Therapy: WFL for tasks assessed/performed;Anxious Overall Cognitive Status: Within Functional Limits for tasks assessed                                        Exercises Other Exercises Other Exercises: Serial sit<>stand x4 for LE strengthening, and to reinforce movement patterns for sit to stand x 5 reps    General Comments        Pertinent Vitals/Pain Pain Assessment: Faces Faces Pain Scale: Hurts even more Pain Location: back pain, periodically grimacing during walk Pain Descriptors / Indicators: Aching;Grimacing;Sore Pain Intervention(s): Monitored during session    Home Living                      Prior Function            PT Goals (current goals can now be found in  the care plan section) Acute Rehab PT Goals Patient Stated Goal: go home PT Goal Formulation: With patient/family Time For Goal Achievement: 04/25/18 Potential to Achieve Goals: Fair Progress towards PT goals: Progressing toward goals    Frequency    Min 5X/week      PT Plan Current plan remains appropriate    Co-evaluation              AM-PAC PT "6 Clicks" Mobility   Outcome Measure  Help needed turning from your back to your side while in a flat bed without using bedrails?: A Lot Help needed moving from lying on your back to  sitting on the side of a flat bed without using bedrails?: A Little Help needed moving to and from a bed to a chair (including a wheelchair)?: A Little Help needed standing up from a chair using your arms (e.g., wheelchair or bedside chair)?: A Little Help needed to walk in hospital room?: A Little Help needed climbing 3-5 steps with a railing? : A Little 6 Click Score: 17    End of Session Equipment Utilized During Treatment: Gait belt(near axillae) Activity Tolerance: Patient tolerated treatment well Patient left: in chair;with call bell/phone within reach;with family/visitor present Nurse Communication: Mobility status;Precautions PT Visit Diagnosis: Other abnormalities of gait and mobility (R26.89);Muscle weakness (generalized) (M62.81);Difficulty in walking, not elsewhere classified (R26.2);Other symptoms and signs involving the nervous system (R29.898);Pain Pain - Right/Left: Left Pain - part of body: Leg     Time: 1003-1037 PT Time Calculation (min) (ACUTE ONLY): 34 min  Charges:  $Gait Training: 8-22 mins $Therapeutic Activity: 8-22 mins                     Roney Marion, [place] Pager [phone] Office [place] 04/16/2018, 1:32 PM

## 2018-04-16 NOTE — Progress Notes (Signed)
Called facility, gave report to [facility], all questions and concerns addressed, Pt's wife to pick-up meds from pharmacy and deliver them to facility. Pt not in distress, to discharge to facility with belongings via [place].

## 2018-04-16 NOTE — Progress Notes (Signed)
Called facility, attempted to give report but put on hold for 10 mins. Will call again later.

## 2018-04-16 NOTE — Progress Notes (Signed)
Patient will Discharge To: [facility] Anticipated DC Date:04/16/2018 Family Notified:yes, spouse Eaton Folmar [phone] Transport By: Corey Harold   Per MD patient ready for DC to [facility]. RN, patient, patient's family, and facility notified of DC. Assessment, Fl2/Pasrr, and Discharge Summary sent to facility. RN given number for report [phone], Room # 104). DC packet on chart. Ambulance transport requested for patient.   CSW signing off.  Reed Breech LCSWA [phone]

## 2018-04-16 NOTE — Plan of Care (Signed)

## 2019-07-24 IMAGING — RF DG LUMBAR SPINE 1V
1 series · 1 of 1 positions shown · non-contrast
Comparison: None.

CLINICAL DATA: L3-L5 posterior fusion

EXAM:
LUMBAR SPINE - 1 VIEW; DG C-ARM 61-120 MIN

[Series 1: run · 1 of 1 slices shown]
[im 1/1]
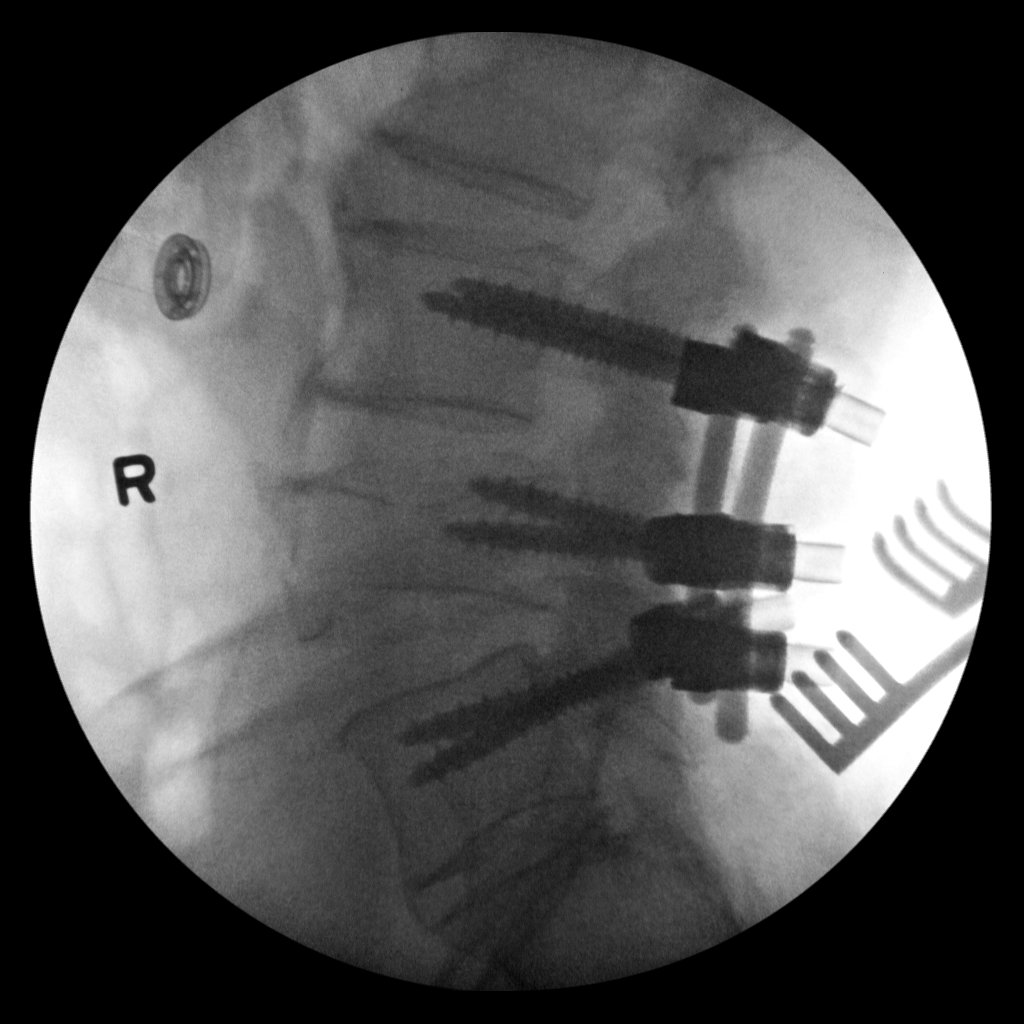

[1 of 1 positions shown; findings below may reference images not displayed]

FINDINGS: Single cross-table lateral spot image of the lumbar spine
demonstrates changes of posterior fusion from L3-L5. Mild
compression deformity at L4. No visible hardware complicating
feature.
IMPRESSION: Posterior fusion changes L3-L5.  Compression deformity at L4.

## 2020-11-17 DIAGNOSIS — G4700 Insomnia, unspecified: Secondary | ICD-10-CM | POA: Diagnosis not present

## 2020-11-17 DIAGNOSIS — Z299 Encounter for prophylactic measures, unspecified: Secondary | ICD-10-CM | POA: Diagnosis not present

## 2020-11-17 DIAGNOSIS — K859 Acute pancreatitis without necrosis or infection, unspecified: Secondary | ICD-10-CM | POA: Diagnosis not present

## 2020-11-17 DIAGNOSIS — M549 Dorsalgia, unspecified: Secondary | ICD-10-CM | POA: Diagnosis not present

## 2021-05-23 DIAGNOSIS — I10 Essential (primary) hypertension: Secondary | ICD-10-CM | POA: Diagnosis not present

## 2021-05-23 DIAGNOSIS — M549 Dorsalgia, unspecified: Secondary | ICD-10-CM | POA: Diagnosis not present

## 2021-05-23 DIAGNOSIS — Z79899 Other long term (current) drug therapy: Secondary | ICD-10-CM | POA: Diagnosis not present

## 2021-05-23 DIAGNOSIS — E7800 Pure hypercholesterolemia, unspecified: Secondary | ICD-10-CM | POA: Diagnosis not present

## 2021-05-23 DIAGNOSIS — Z299 Encounter for prophylactic measures, unspecified: Secondary | ICD-10-CM | POA: Diagnosis not present

## 2021-05-23 DIAGNOSIS — G4700 Insomnia, unspecified: Secondary | ICD-10-CM | POA: Diagnosis not present

## 2021-07-31 DIAGNOSIS — Z7189 Other specified counseling: Secondary | ICD-10-CM | POA: Diagnosis not present

## 2021-07-31 DIAGNOSIS — R5383 Other fatigue: Secondary | ICD-10-CM | POA: Diagnosis not present

## 2021-07-31 DIAGNOSIS — Z789 Other specified health status: Secondary | ICD-10-CM | POA: Diagnosis not present

## 2021-07-31 DIAGNOSIS — Z0000 Encounter for general adult medical examination without abnormal findings: Secondary | ICD-10-CM | POA: Diagnosis not present

## 2021-07-31 DIAGNOSIS — E7800 Pure hypercholesterolemia, unspecified: Secondary | ICD-10-CM | POA: Diagnosis not present

## 2021-07-31 DIAGNOSIS — Z79899 Other long term (current) drug therapy: Secondary | ICD-10-CM | POA: Diagnosis not present

## 2021-07-31 DIAGNOSIS — Z299 Encounter for prophylactic measures, unspecified: Secondary | ICD-10-CM | POA: Diagnosis not present

## 2021-07-31 DIAGNOSIS — I10 Essential (primary) hypertension: Secondary | ICD-10-CM | POA: Diagnosis not present

## 2021-11-24 DIAGNOSIS — R748 Abnormal levels of other serum enzymes: Secondary | ICD-10-CM | POA: Diagnosis not present

## 2021-11-24 DIAGNOSIS — M549 Dorsalgia, unspecified: Secondary | ICD-10-CM | POA: Diagnosis not present

## 2021-11-24 DIAGNOSIS — G4700 Insomnia, unspecified: Secondary | ICD-10-CM | POA: Diagnosis not present

## 2021-11-24 DIAGNOSIS — Z79899 Other long term (current) drug therapy: Secondary | ICD-10-CM | POA: Diagnosis not present

## 2021-11-24 DIAGNOSIS — I10 Essential (primary) hypertension: Secondary | ICD-10-CM | POA: Diagnosis not present

## 2021-11-24 DIAGNOSIS — Z299 Encounter for prophylactic measures, unspecified: Secondary | ICD-10-CM | POA: Diagnosis not present

## 2022-02-23 DIAGNOSIS — Z299 Encounter for prophylactic measures, unspecified: Secondary | ICD-10-CM | POA: Diagnosis not present

## 2022-02-23 DIAGNOSIS — G4700 Insomnia, unspecified: Secondary | ICD-10-CM | POA: Diagnosis not present

## 2022-02-23 DIAGNOSIS — M549 Dorsalgia, unspecified: Secondary | ICD-10-CM | POA: Diagnosis not present

## 2022-02-23 DIAGNOSIS — R52 Pain, unspecified: Secondary | ICD-10-CM | POA: Diagnosis not present

## 2022-02-23 DIAGNOSIS — I10 Essential (primary) hypertension: Secondary | ICD-10-CM | POA: Diagnosis not present

## 2022-02-23 DIAGNOSIS — Z79899 Other long term (current) drug therapy: Secondary | ICD-10-CM | POA: Diagnosis not present

## 2022-06-04 DIAGNOSIS — Z79899 Other long term (current) drug therapy: Secondary | ICD-10-CM | POA: Diagnosis not present

## 2022-06-04 DIAGNOSIS — I10 Essential (primary) hypertension: Secondary | ICD-10-CM | POA: Diagnosis not present

## 2022-06-04 DIAGNOSIS — Z0000 Encounter for general adult medical examination without abnormal findings: Secondary | ICD-10-CM | POA: Diagnosis not present

## 2022-06-04 DIAGNOSIS — Z87891 Personal history of nicotine dependence: Secondary | ICD-10-CM | POA: Diagnosis not present

## 2022-06-04 DIAGNOSIS — R5383 Other fatigue: Secondary | ICD-10-CM | POA: Diagnosis not present

## 2022-06-04 DIAGNOSIS — Z7189 Other specified counseling: Secondary | ICD-10-CM | POA: Diagnosis not present

## 2022-06-04 DIAGNOSIS — E7800 Pure hypercholesterolemia, unspecified: Secondary | ICD-10-CM | POA: Diagnosis not present

## 2022-06-04 DIAGNOSIS — Z299 Encounter for prophylactic measures, unspecified: Secondary | ICD-10-CM | POA: Diagnosis not present

## 2022-07-09 DIAGNOSIS — H524 Presbyopia: Secondary | ICD-10-CM | POA: Diagnosis not present

## 2022-07-09 DIAGNOSIS — H52223 Regular astigmatism, bilateral: Secondary | ICD-10-CM | POA: Diagnosis not present

## 2022-07-09 DIAGNOSIS — Z961 Presence of intraocular lens: Secondary | ICD-10-CM | POA: Diagnosis not present

## 2022-09-10 ENCOUNTER — Encounter: Payer: Self-pay | Admitting: *Deleted

## 2022-10-15 ENCOUNTER — Telehealth: Payer: Self-pay | Admitting: Internal Medicine

## 2022-10-15 NOTE — Telephone Encounter (Signed)
Questionnaire is in review.

## 2022-10-17 NOTE — Addendum Note (Signed)
Addended by: Armstead Peaks on: 10/17/2022 02:35 PM   Modules accepted: Orders

## 2022-10-17 NOTE — Telephone Encounter (Signed)
  Procedure: colonoscopy  Estimated body mass index is 27.91 kg/m as calculated from the following:   Height as of 04/10/18: 5\' 9"  (1.753 m).   Weight as of 04/10/18: 189 lb (85.7 kg).   Have you had a colonoscopy before?  10/09/17, Dr. Jena Gauss  Do you have family history of colon cancer?  no  Do you have a family history of polyps? no  Previous colonoscopy with polyps removed? no  Do you have a history colorectal cancer?   no  Are you diabetic?  no  Do you have a prosthetic or mechanical heart valve? no  Do you have a pacemaker/defibrillator?   no  Have you had endocarditis/atrial fibrillation?  no  Do you use supplemental oxygen/CPAP?  no  Have you had joint replacement within the last 12 months?  no  Do you tend to be constipated or have to use laxatives?  no   Do you have history of alcohol use? If yes, how much and how often.  Quit 3 months ago  Do you have history or are you using drugs? If yes, what do are you  using?  no  Have you ever had a stroke/heart attack?  no  Have you ever had a heart or other vascular stent placed,?no  Do you take weight loss medication? no   Do you take any blood-thinning medications such as: (Plavix, aspirin, Coumadin, Aggrenox, Brilinta, Xarelto, Eliquis, Pradaxa, Savaysa or Effient)? no  If yes we need the name, milligram, dosage and who is prescribing doctor:               Current Outpatient Medications  Medication Sig Dispense Refill   diclofenac (VOLTAREN) 75 MG EC tablet Take 75 mg by mouth 2 (two) times daily.     escitalopram (LEXAPRO) 20 MG tablet Take 20 mg by mouth daily.     lisinopril (PRINIVIL,ZESTRIL) 10 MG tablet Take 10 mg by mouth daily.     loratadine (CLARITIN) 10 MG tablet Take 10 mg by mouth daily.     omeprazole (PRILOSEC) 20 MG capsule Take 20 mg by mouth daily.  3   rosuvastatin (CRESTOR) 20 MG tablet Take 20 mg by mouth daily.     zolpidem (AMBIEN) 5 MG tablet Take 5 mg by mouth at bedtime.  2   No  current facility-administered medications for this visit.    No Known Allergies

## 2022-11-05 DIAGNOSIS — G4700 Insomnia, unspecified: Secondary | ICD-10-CM | POA: Diagnosis not present

## 2022-11-05 DIAGNOSIS — M549 Dorsalgia, unspecified: Secondary | ICD-10-CM | POA: Diagnosis not present

## 2022-11-05 DIAGNOSIS — R52 Pain, unspecified: Secondary | ICD-10-CM | POA: Diagnosis not present

## 2022-11-19 ENCOUNTER — Telehealth: Payer: Self-pay | Admitting: Internal Medicine

## 2022-11-19 MED ORDER — NA SULFATE-K SULFATE-MG SULF 17.5-3.13-1.6 GM/177ML PO SOLN: ORAL | 0 refills | Status: AC

## 2022-11-19 NOTE — Telephone Encounter (Signed)
Pt called back. Scheduled for 10/3. Rx for prep sent to pharmacy. Instructions sent also.

## 2022-11-19 NOTE — Telephone Encounter (Signed)
LMOVM to call back

## 2022-11-19 NOTE — Addendum Note (Signed)
Addended by: Armstead Peaks on: 11/19/2022 09:05 AM   Modules accepted: Orders

## 2022-11-19 NOTE — Telephone Encounter (Signed)
Patient left a message that someone just called him.  I saw your note where you left him a message to call back.

## 2022-11-19 NOTE — Telephone Encounter (Signed)
See prior note. Already spoke with pt

## 2022-11-27 NOTE — Telephone Encounter (Signed)
Questionnaire from recall, no referral needed  

## 2022-11-29 ENCOUNTER — Encounter (HOSPITAL_COMMUNITY)
Admission: RE | Disposition: A | Discharge status: Home / Self Care | Payer: Self-pay | Source: Home / Self Care | Attending: Internal Medicine

## 2022-11-29 ENCOUNTER — Telehealth: Payer: Self-pay | Admitting: *Deleted

## 2022-11-29 ENCOUNTER — Encounter (HOSPITAL_COMMUNITY): Payer: Self-pay | Admitting: Internal Medicine

## 2022-11-29 ENCOUNTER — Ambulatory Visit (HOSPITAL_COMMUNITY)
Admission: RE | Admit: 2022-11-29 | Discharge: 2022-11-29 | Disposition: A | Discharge status: Home / Self Care | Payer: Medicare Other | Attending: Internal Medicine | Admitting: Internal Medicine

## 2022-11-29 ENCOUNTER — Ambulatory Visit (HOSPITAL_BASED_OUTPATIENT_CLINIC_OR_DEPARTMENT_OTHER): Payer: Medicare Other | Admitting: Anesthesiology

## 2022-11-29 ENCOUNTER — Other Ambulatory Visit: Payer: Self-pay

## 2022-11-29 ENCOUNTER — Ambulatory Visit (HOSPITAL_COMMUNITY): Payer: Medicare Other | Admitting: Anesthesiology

## 2022-11-29 DIAGNOSIS — K5730 Diverticulosis of large intestine without perforation or abscess without bleeding: Secondary | ICD-10-CM

## 2022-11-29 DIAGNOSIS — Z1211 Encounter for screening for malignant neoplasm of colon: Secondary | ICD-10-CM | POA: Diagnosis not present

## 2022-11-29 DIAGNOSIS — Z860100 Personal history of colon polyps, unspecified: Secondary | ICD-10-CM | POA: Diagnosis not present

## 2022-11-29 DIAGNOSIS — I10 Essential (primary) hypertension: Secondary | ICD-10-CM | POA: Diagnosis not present

## 2022-11-29 DIAGNOSIS — Z860101 Personal history of adenomatous and serrated colon polyps: Secondary | ICD-10-CM | POA: Diagnosis not present

## 2022-11-29 DIAGNOSIS — Z87891 Personal history of nicotine dependence: Secondary | ICD-10-CM | POA: Insufficient documentation

## 2022-11-29 HISTORY — PX: COLONOSCOPY WITH PROPOFOL: SHX5780

## 2022-11-29 SURGERY — COLONOSCOPY WITH PROPOFOL
Anesthesia: General

## 2022-11-29 MED ORDER — PROPOFOL 500 MG/50ML IV EMUL
INTRAVENOUS | Status: DC | PRN
  Administered 2022-11-29: 125 ug/kg/min via INTRAVENOUS

## 2022-11-29 MED ORDER — STERILE WATER FOR IRRIGATION IR SOLN
Status: DC | PRN
  Administered 2022-11-29: 60 mL

## 2022-11-29 MED ORDER — LACTATED RINGERS IV SOLN: INTRAVENOUS | Status: DC

## 2022-11-29 MED ORDER — PROPOFOL 10 MG/ML IV BOLUS
INTRAVENOUS | Status: DC | PRN
  Administered 2022-11-29: 130 mg via INTRAVENOUS
  Administered 2022-11-29 (×2): 40 mg via INTRAVENOUS

## 2022-11-29 NOTE — H&P (Signed)
@LOGO @   Primary Care Physician:  Orlene Plum, NP Primary Gastroenterologist:  Dr. Jena Gauss  Pre-Procedure History & Physical: HPI:  Brendan Sherman is a 67 y.o. male here for  a surveillance colonoscopy.  History of rectal adenoma removed in 2019.  No bowel symptoms at this time.  Past Medical History:  Diagnosis Date   Anxiety    Hypertension     Past Surgical History:  Procedure Laterality Date   BACK SURGERY     COLONOSCOPY N/A 10/09/2017   Procedure: COLONOSCOPY;  Surgeon: Corbin Ade, MD;  Location: [facility];  Service: Endoscopy;  Laterality: N/A;  7:30   HERNIA REPAIR      Prior to Admission medications   Medication Sig Start Date End Date Taking? Authorizing Provider  diclofenac (VOLTAREN) 75 MG EC tablet Take 75 mg by mouth 2 (two) times daily. 03/22/18  Yes [provider]  escitalopram (LEXAPRO) 20 MG tablet Take 20 mg by mouth daily. 03/22/18  Yes [provider]  lisinopril (PRINIVIL,ZESTRIL) 10 MG tablet Take 10 mg by mouth daily. 03/22/18  Yes [provider]  loratadine (CLARITIN) 10 MG tablet Take 10 mg by mouth daily.   Yes [provider]  omeprazole (PRILOSEC) 20 MG capsule Take 20 mg by mouth daily. 08/21/17  Yes [provider]  rosuvastatin (CRESTOR) 20 MG tablet Take 20 mg by mouth daily.   Yes [provider]  zolpidem (AMBIEN) 5 MG tablet Take 5 mg by mouth at bedtime. 08/27/17  Yes [provider]  Na Sulfate-K Sulfate-Mg Sulf 17.5-3.13-1.6 GM/177ML SOLN As directed 11/19/22   Katy Brickell, Gerrit Friends, MD    Allergies as of 11/19/2022   (No Known Allergies)    History reviewed. No pertinent family history.  Social History   Socioeconomic History   Marital status: Married    Spouse name: Not on file   Number of children: Not on file   Years of education: Not on file   Highest education level: Not on file  Occupational History   Not on file  Tobacco Use   Smoking status: Former    Smokeless tobacco: Former  Advertising account planner   Vaping status: Never Used  Substance and Sexual Activity   Alcohol use: Yes    Comment: rarely   Drug use: Never   Sexual activity: Not on file  Other Topics Concern   Not on file  Social History Narrative   Not on file   Social Determinants of Health   Financial Resource Strain: Not on file  Food Insecurity: Not on file  Transportation Needs: Not on file  Physical Activity: Not on file  Stress: Not on file  Social Connections: Not on file  Intimate Partner Violence: Not on file    Review of Systems: See HPI, otherwise negative ROS  Physical Exam: BP 131/86   Pulse 67   Temp 98.1 F (36.7 C) (Oral)   Ht 5\' 9"  (1.753 m)   Wt 85.7 kg   SpO2 98%   BMI 27.91 kg/m  General:   Alert,  Well-developed, well-nourished, pleasant and cooperative in NAD Abdomen: Non-distended, normal bowel sounds.  Soft and nontender without appreciable mass or hepatosplenomegaly.   Impression/Plan:     67 year old gentleman here for  a surveillance colonoscopy history of colonic adenoma removed previously. The risks, benefits, limitations, alternatives and imponderables have been reviewed with the patient. Questions have been answered. All parties are agreeable.       Notice: This dictation  was prepared with Dragon dictation along with smaller phrase technology. Any transcriptional errors that result from this process are unintentional and may not be corrected upon review.

## 2022-11-29 NOTE — Anesthesia Postprocedure Evaluation (Signed)
Anesthesia Post Note  Patient: Brendan Sherman  Procedure(s) Performed: COLONOSCOPY WITH PROPOFOL  Patient location during evaluation: PACU Anesthesia Type: General Level of consciousness: awake and alert Pain management: pain level controlled Vital Signs Assessment: post-procedure vital signs reviewed and stable Respiratory status: spontaneous breathing, nonlabored ventilation, respiratory function stable and patient connected to nasal cannula oxygen Cardiovascular status: blood pressure returned to baseline and stable Postop Assessment: no apparent nausea or vomiting Anesthetic complications: no   There were no known notable events for this encounter.   Last Vitals:  Vitals:   11/29/22 1241 11/29/22 1445  BP: 131/86 98/66  Pulse: 67 80  Resp:  16  Temp: 36.7 C 36.4 C  SpO2: 98% 95%    Last Pain:  Vitals:   11/29/22 1445  TempSrc: Axillary  PainSc: 0-No pain                 Curby Carswell L Elisa Sorlie

## 2022-11-29 NOTE — Anesthesia Procedure Notes (Signed)
Date/Time: 11/29/2022 2:19 PM  Performed by: Franco Nones, CRNAPre-anesthesia Checklist: Patient identified, Emergency Drugs available, Suction available, Timeout performed and Patient being monitored Patient Re-evaluated:Patient Re-evaluated prior to induction Oxygen Delivery Method: Nasal Cannula

## 2022-11-29 NOTE — Op Note (Signed)
[facility] Patient Name: Brendan Sherman Procedure Date: 11/29/2022 1:49 PM MRN: [id number] Date of Birth: 06/07/1955 Attending MD: Gennette Pac , MD, [id number] CSN: [id number] Age: 67 Admit Type: Outpatient Procedure:                Colonoscopy Indications:              High risk colon cancer surveillance: Personal                            history of colonic polyps Providers:                Gennette Pac, MD, Crystal Page, Francoise Ceo                            RN, RN, Dyann Ruddle, Elinor Parkinson Referring MD:              Medicines:                Propofol per Anesthesia Complications:            No immediate complications. Estimated Blood Loss:     Estimated blood loss: none. Procedure:                Pre-Anesthesia Assessment:                           - Prior to the procedure, a History and Physical                            was performed, and patient medications and                            allergies were reviewed. The patient's tolerance of                            previous anesthesia was also reviewed. The risks                            and benefits of the procedure and the sedation                            options and risks were discussed with the patient.                            All questions were answered, and informed consent                            was obtained. Prior Anticoagulants: The patient has                            taken no anticoagulant or antiplatelet agents. ASA                            Grade Assessment: II - A patient with mild systemic  disease. After reviewing the risks and benefits,                            the patient was deemed in satisfactory condition to                            undergo the procedure.                           After obtaining informed consent, the colonoscope                            was passed under direct vision. Throughout the                             procedure, the patient's blood pressure, pulse, and                            oxygen saturations were monitored continuously. The                            [id number]) scope was introduced through the                            anus and advanced to the the cecum, identified by                            appendiceal orifice and ileocecal valve. The                            colonoscopy was performed without difficulty. The                            patient tolerated the procedure well. The quality                            of the prep was inadequate. All of the mucosal                            detail was not seen because of the poor prep. Scope In: 2:25:40 PM Scope Out: 2:41:26 PM Scope Withdrawal Time: 0 hours 6 minutes 24 seconds  Total Procedure Duration: 0 hours 15 minutes 46 seconds  Findings:      Scattered medium-mouthed diverticula were found in the sigmoid colon and       descending colon. Tenacious stool      Much of the colon particularly transverse and descending segments. Would       not wash all. This made exam much more difficult. There was a       significant portion of the colonic mucosa was not seen today. Impression:               - Diverticulosis in the sigmoid colon and in the  descending colon. Inadquate adequate preparation.                            Incomplete examination.                           - No specimens collected. Moderate Sedation:      Moderate (conscious) sedation was personally administered by an       anesthesia professional. The following parameters were monitored: oxygen       saturation, heart rate, blood pressure, respiratory rate, EKG, adequacy       of pulmonary ventilation, and response to care. Recommendation:           - Patient has a contact number available for                            emergencies. The signs and symptoms of potential                            delayed complications were discussed  with the                            patient. Return to normal activities tomorrow.                            Written discharge instructions were provided to the                            patient.                           - Advance diet as tolerated. He had a 5 mm polyp                            removed from his rectum in 2019; given today's                            findings. I recommend he come back in 2 years with                            a good preparation for surveillance colonoscopy. Procedure Code(s):        --- Professional ---                           [id number], Colonoscopy, flexible; diagnostic, including                            collection of specimen(s) by brushing or washing,                            when performed (separate procedure) Diagnosis Code(s):        --- Professional ---                           Z86.010, Personal history of colonic polyps  K57.30, Diverticulosis of large intestine without                            perforation or abscess without bleeding CPT copyright 2022 [employer] Association. All rights reserved. The codes documented in this report are preliminary and upon coder review may  be revised to meet current compliance requirements. Gerrit Friends. Dianne Bady, MD Gennette Pac, MD 11/29/2022 2:53:21 PM This report has been signed electronically. Number of Addenda: 0

## 2022-11-29 NOTE — Transfer of Care (Signed)
Immediate Anesthesia Transfer of Care Note  Patient: Brendan Sherman  Procedure(s) Performed: COLONOSCOPY WITH PROPOFOL  Patient Location: Endoscopy Unit  Anesthesia Type:General  Level of Consciousness: drowsy  Airway & Oxygen Therapy: Patient Spontanous Breathing  Post-op Assessment: Report given to RN  Post vital signs: Reviewed and stable  Last Vitals:  Vitals Value Taken Time  BP 98/66 11/29/22 1445  Temp 36.4 C 11/29/22 1445  Pulse 80 11/29/22 1445  Resp 16 11/29/22 1445  SpO2 95 % 11/29/22 1445    Last Pain:  Vitals:   11/29/22 1445  TempSrc: Axillary  PainSc: 0-No pain         Complications: No notable events documented.

## 2022-11-29 NOTE — Telephone Encounter (Signed)
-----   Message from Eula Listen sent at 11/29/2022  2:46 PM EDT -----  poor prep today.  It is recommended he come back in 2 years for another exam.  He needs a much better prep.  Double prep, etc.  Thanks please make a note.

## 2022-11-29 NOTE — Discharge Instructions (Addendum)
  Colonoscopy Discharge Instructions  Read the instructions outlined below and refer to this sheet in the next few weeks. These discharge instructions provide you with general information on caring for yourself after you leave the hospital. Your doctor may also give you specific instructions. While your treatment has been planned according to the most current medical practices available, unavoidable complications occasionally occur. If you have any problems or questions after discharge, call Dr. Jena Gauss at [phone]. ACTIVITY You may resume your regular activity, but move at a slower pace for the next 24 hours.  Take frequent rest periods for the next 24 hours.  Walking will help get rid of the air and reduce the bloated feeling in your belly (abdomen).  No driving for 24 hours (because of the medicine (anesthesia) used during the test).   Do not sign any important legal documents or operate any machinery for 24 hours (because of the anesthesia used during the test).  NUTRITION Drink plenty of fluids.  You may resume your normal diet as instructed by your doctor.  Begin with a light meal and progress to your normal diet. Heavy or fried foods are harder to digest and may make you feel sick to your stomach (nauseated).  Avoid alcoholic beverages for 24 hours or as instructed.  MEDICATIONS You may resume your normal medications unless your doctor tells you otherwise.  WHAT YOU CAN EXPECT TODAY Some feelings of bloating in the abdomen.  Passage of more gas than usual.  Spotting of blood in your stool or on the toilet paper.  IF YOU HAD POLYPS REMOVED DURING THE COLONOSCOPY: No aspirin products for 7 days or as instructed.  No alcohol for 7 days or as instructed.  Eat a soft diet for the next 24 hours.  FINDING OUT THE RESULTS OF YOUR TEST Not all test results are available during your visit. If your test results are not back during the visit, make an appointment with your caregiver to find out the  results. Do not assume everything is normal if you have not heard from your caregiver or the medical facility. It is important for you to follow up on all of your test results.  SEEK IMMEDIATE MEDICAL ATTENTION IF: You have more than a spotting of blood in your stool.  Your belly is swollen (abdominal distention).  You are nauseated or vomiting.  You have a temperature over 101.  You have abdominal pain or discomfort that is severe or gets worse throughout the day.        You have diverticulosis.  I saw no polyps but your colonoscopy preparation was poor.    I recommend you return for repeat colonoscopy in 2 years with a better preparation.    At patient request, I called Bonita Quin at [phone] -  discussed findings and recommendations

## 2022-11-29 NOTE — Anesthesia Preprocedure Evaluation (Signed)
Anesthesia Evaluation  Patient identified by MRN, date of birth, ID band Patient awake    Reviewed: Allergy & Precautions, H&P , NPO status , Patient's Chart, lab work & pertinent test results  Airway Mallampati: II  TM Distance: >3 FB Neck ROM: full    Dental  (+) Dental Advisory Given   Pulmonary former smoker   Pulmonary exam normal breath sounds clear to auscultation       Cardiovascular hypertension, Normal cardiovascular exam Rhythm:regular Rate:Normal     Neuro/Psych  PSYCHIATRIC DISORDERS Anxiety     negative neurological ROS     GI/Hepatic negative GI ROS, Neg liver ROS,,,  Endo/Other  negative endocrine ROS    Renal/GU negative Renal ROS     Musculoskeletal negative musculoskeletal ROS (+)  Lumbar spine fx s/p fall from roof.   Abdominal   Peds  Hematology   Anesthesia Other Findings   Reproductive/Obstetrics                             Anesthesia Physical Anesthesia Plan  ASA: 2 and emergent  Anesthesia Plan: General   Post-op Pain Management: Minimal or no pain anticipated   Induction: Intravenous  PONV Risk Score and Plan:   Airway Management Planned: Nasal Cannula and Natural Airway  Additional Equipment: None  Intra-op Plan:   Post-operative Plan: Extubation in OR  Informed Consent: I have reviewed the patients History and Physical, chart, labs and discussed the procedure including the risks, benefits and alternatives for the proposed anesthesia with the patient or authorized representative who has indicated his/her understanding and acceptance.       Plan Discussed with: CRNA  Anesthesia Plan Comments:         Anesthesia Quick Evaluation

## 2022-12-07 ENCOUNTER — Encounter (HOSPITAL_COMMUNITY): Payer: Self-pay | Admitting: Internal Medicine

## 2023-04-15 DIAGNOSIS — J329 Chronic sinusitis, unspecified: Secondary | ICD-10-CM | POA: Diagnosis not present

## 2023-04-15 DIAGNOSIS — I10 Essential (primary) hypertension: Secondary | ICD-10-CM | POA: Diagnosis not present

## 2023-04-15 DIAGNOSIS — G4700 Insomnia, unspecified: Secondary | ICD-10-CM | POA: Diagnosis not present

## 2023-04-15 DIAGNOSIS — H00015 Hordeolum externum left lower eyelid: Secondary | ICD-10-CM | POA: Diagnosis not present

## 2023-04-15 DIAGNOSIS — M549 Dorsalgia, unspecified: Secondary | ICD-10-CM | POA: Diagnosis not present

## 2023-04-15 DIAGNOSIS — Z299 Encounter for prophylactic measures, unspecified: Secondary | ICD-10-CM | POA: Diagnosis not present

## 2023-07-11 DIAGNOSIS — Z299 Encounter for prophylactic measures, unspecified: Secondary | ICD-10-CM | POA: Diagnosis not present

## 2023-07-11 DIAGNOSIS — Z79899 Other long term (current) drug therapy: Secondary | ICD-10-CM | POA: Diagnosis not present

## 2023-07-11 DIAGNOSIS — H00022 Hordeolum internum right lower eyelid: Secondary | ICD-10-CM | POA: Diagnosis not present

## 2023-07-11 DIAGNOSIS — M549 Dorsalgia, unspecified: Secondary | ICD-10-CM | POA: Diagnosis not present

## 2023-07-11 DIAGNOSIS — R52 Pain, unspecified: Secondary | ICD-10-CM | POA: Diagnosis not present

## 2023-07-11 DIAGNOSIS — I10 Essential (primary) hypertension: Secondary | ICD-10-CM | POA: Diagnosis not present

## 2023-11-01 DIAGNOSIS — M19019 Primary osteoarthritis, unspecified shoulder: Secondary | ICD-10-CM | POA: Diagnosis not present

## 2023-11-01 DIAGNOSIS — Z1389 Encounter for screening for other disorder: Secondary | ICD-10-CM | POA: Diagnosis not present

## 2023-11-01 DIAGNOSIS — Z299 Encounter for prophylactic measures, unspecified: Secondary | ICD-10-CM | POA: Diagnosis not present

## 2023-11-01 DIAGNOSIS — Z7189 Other specified counseling: Secondary | ICD-10-CM | POA: Diagnosis not present

## 2023-11-01 DIAGNOSIS — I10 Essential (primary) hypertension: Secondary | ICD-10-CM | POA: Diagnosis not present

## 2023-11-01 DIAGNOSIS — Z0000 Encounter for general adult medical examination without abnormal findings: Secondary | ICD-10-CM | POA: Diagnosis not present
